# Patient Record
Sex: Female | Born: 1970 | Race: Asian | Hispanic: No | Marital: Married | Smoking: Never smoker
Health system: Southern US, Community
[De-identification: ages and names within clinical notes are randomized; demographics above are authoritative.]

## PROBLEM LIST (undated history)

## (undated) DIAGNOSIS — O24419 Gestational diabetes mellitus in pregnancy, unspecified control: Secondary | ICD-10-CM

## (undated) DIAGNOSIS — E782 Mixed hyperlipidemia: Secondary | ICD-10-CM

## (undated) DIAGNOSIS — J309 Allergic rhinitis, unspecified: Secondary | ICD-10-CM

## (undated) DIAGNOSIS — G5602 Carpal tunnel syndrome, left upper limb: Secondary | ICD-10-CM

## (undated) DIAGNOSIS — E119 Type 2 diabetes mellitus without complications: Secondary | ICD-10-CM

## (undated) DIAGNOSIS — Z91018 Allergy to other foods: Secondary | ICD-10-CM

## (undated) DIAGNOSIS — E042 Nontoxic multinodular goiter: Secondary | ICD-10-CM

## (undated) DIAGNOSIS — N201 Calculus of ureter: Secondary | ICD-10-CM

## (undated) DIAGNOSIS — G43909 Migraine, unspecified, not intractable, without status migrainosus: Secondary | ICD-10-CM

## (undated) HISTORY — DX: Nontoxic multinodular goiter: E04.2

## (undated) HISTORY — DX: Calculus of ureter: N20.1

## (undated) HISTORY — DX: Carpal tunnel syndrome, left upper limb: G56.02

## (undated) HISTORY — DX: Mixed hyperlipidemia: E78.2

## (undated) HISTORY — DX: Allergy to other foods: Z91.018

## (undated) HISTORY — DX: Type 2 diabetes mellitus without complications: E11.9

## (undated) HISTORY — DX: Allergic rhinitis, unspecified: J30.9

## (undated) HISTORY — PX: KNEE ARTHROSCOPY: SUR90

---

## 1998-10-10 HISTORY — PX: WISDOM TOOTH EXTRACTION: SHX21

## 2010-04-23 ENCOUNTER — Encounter: Admission: RE | Admit: 2010-04-23 | Discharge: 2010-04-23 | Payer: Self-pay | Admitting: Obstetrics and Gynecology

## 2010-12-28 LAB — RUBELLA ANTIBODY, IGM: Rubella: IMMUNE

## 2010-12-28 LAB — ANTIBODY SCREEN: Antibody Screen: NEGATIVE

## 2010-12-28 LAB — HEPATITIS B SURFACE ANTIGEN: Hepatitis B Surface Ag: NEGATIVE

## 2010-12-28 LAB — GC/CHLAMYDIA PROBE AMP, GENITAL
Chlamydia: NEGATIVE
Gonorrhea: NEGATIVE

## 2011-06-17 ENCOUNTER — Encounter: Payer: Managed Care, Other (non HMO) | Attending: Obstetrics and Gynecology | Admitting: Dietician

## 2011-06-17 ENCOUNTER — Encounter: Payer: Self-pay | Admitting: Dietician

## 2011-06-17 DIAGNOSIS — Z713 Dietary counseling and surveillance: Secondary | ICD-10-CM | POA: Insufficient documentation

## 2011-06-17 DIAGNOSIS — O99810 Abnormal glucose complicating pregnancy: Secondary | ICD-10-CM | POA: Insufficient documentation

## 2011-06-17 DIAGNOSIS — O24419 Gestational diabetes mellitus in pregnancy, unspecified control: Secondary | ICD-10-CM

## 2011-06-17 NOTE — Progress Notes (Signed)
  Patient was seen on 06/17/2011 for Gestational Diabetes self-management class at the [facility]. The following learning objectives were met by the patient during this course:   States the definition of Gestational Diabetes  States why dietary management is important in controlling blood glucose  Describes the effects each nutrient has on blood glucose levels  Demonstrates ability to create a balanced meal plan  Demonstrates carbohydrate counting   States when to check blood glucose levels  Demonstrates proper blood glucose monitoring techniques  States the effect of stress and exercise on blood glucose levels  States the importance of limiting caffeine and abstaining from alcohol and smoking  Blood glucose monitor given: One Touch Ultra  Lot # [id number] X Exp: 01/2012 Blood glucose reading: 104  Patient instructed to monitor glucose levels:Fasting and 2 hours after 1st bite of each meal FBS: 60 - <90 1 hour: <140 2 hour: <120  *Patient received handouts:  Nutrition Diabetes and Pregnancy  Carbohydrate Counting List  Patient will be seen for follow-up as needed.

## 2011-07-31 ENCOUNTER — Inpatient Hospital Stay (HOSPITAL_COMMUNITY)
Admission: AD | Admit: 2011-07-31 | Discharge: 2011-07-31 | Disposition: A | Discharge status: Home / Self Care | Payer: Managed Care, Other (non HMO) | Source: Ambulatory Visit | Attending: Obstetrics and Gynecology | Admitting: Obstetrics and Gynecology

## 2011-07-31 ENCOUNTER — Encounter (HOSPITAL_COMMUNITY): Payer: Self-pay | Admitting: *Deleted

## 2011-07-31 DIAGNOSIS — O479 False labor, unspecified: Secondary | ICD-10-CM | POA: Insufficient documentation

## 2011-07-31 HISTORY — DX: Gestational diabetes mellitus in pregnancy, unspecified control: O24.419

## 2011-07-31 NOTE — Progress Notes (Signed)
Pt presents to MAU with complaints of contractions. Pt states the contractions have gotten "tight" in the last 2 days. Pt states she has noticed increased mucous.

## 2011-07-31 NOTE — Progress Notes (Signed)
Pt reports having increasing ctx since last night. reports mucusy discharge. Pt reports good fetal movement

## 2011-07-31 NOTE — Progress Notes (Signed)
Dr. Rana Snare called regarding patient Tiffany Hicks. Notified him of cervical exam 1cm, 50%, -3, pt complaints of increased mucous, and would like to stay for 1 hr for monitoring due to far distance from home. Dr. Rana Snare OK with plan of care. Will recheck cervix in 1 hr.

## 2011-08-03 ENCOUNTER — Inpatient Hospital Stay (HOSPITAL_COMMUNITY)
Admission: AD | Admit: 2011-08-03 | Discharge: 2011-08-06 | DRG: 766 | Disposition: A | Discharge status: Home / Self Care | Payer: Managed Care, Other (non HMO) | Source: Ambulatory Visit | Attending: Obstetrics and Gynecology | Admitting: Obstetrics and Gynecology

## 2011-08-03 ENCOUNTER — Encounter (HOSPITAL_COMMUNITY): Payer: Self-pay | Admitting: *Deleted

## 2011-08-03 ENCOUNTER — Encounter (HOSPITAL_COMMUNITY): Payer: Self-pay | Admitting: Anesthesiology

## 2011-08-03 ENCOUNTER — Encounter (HOSPITAL_COMMUNITY)
Admission: AD | Disposition: A | Discharge status: Home / Self Care | Payer: Self-pay | Source: Ambulatory Visit | Attending: Obstetrics and Gynecology

## 2011-08-03 ENCOUNTER — Inpatient Hospital Stay (HOSPITAL_COMMUNITY): Payer: Managed Care, Other (non HMO) | Admitting: Anesthesiology

## 2011-08-03 DIAGNOSIS — O09529 Supervision of elderly multigravida, unspecified trimester: Secondary | ICD-10-CM | POA: Diagnosis present

## 2011-08-03 LAB — CBC
MCV: 91.7 fL (ref 78.0–100.0)
Platelets: 228 10*3/uL (ref 150–400)
RDW: 14.7 % (ref 11.5–15.5)
WBC: 8.4 10*3/uL (ref 4.0–10.5)

## 2011-08-03 LAB — GLUCOSE, CAPILLARY: Glucose-Capillary: 93 mg/dL (ref 70–99)

## 2011-08-03 LAB — RPR: RPR Ser Ql: NONREACTIVE

## 2011-08-03 SURGERY — Surgical Case
Anesthesia: Epidural | Site: Abdomen | Wound class: Clean Contaminated

## 2011-08-03 MED ORDER — LANOLIN HYDROUS EX OINT: 1.0000 "application " | TOPICAL_OINTMENT | CUTANEOUS | Status: DC | PRN

## 2011-08-03 MED ORDER — LACTATED RINGERS IV SOLN: 500.0000 mL | Freq: Once | INTRAVENOUS | Status: DC

## 2011-08-03 MED ORDER — ONDANSETRON HCL 4 MG/2ML IJ SOLN: 4.0000 mg | Freq: Three times a day (TID) | INTRAMUSCULAR | Status: DC | PRN

## 2011-08-03 MED ORDER — MENTHOL 3 MG MT LOZG
1.0000 | LOZENGE | OROMUCOSAL | Status: DC | PRN
  Administered 2011-08-04: 3 mg via ORAL
  Filled 2011-08-03: qty 9

## 2011-08-03 MED ORDER — FENTANYL CITRATE 0.05 MG/ML IJ SOLN
100.0000 ug | Freq: Once | INTRAMUSCULAR | Status: DC
  Filled 2011-08-03: qty 2

## 2011-08-03 MED ORDER — TETANUS-DIPHTH-ACELL PERTUSSIS 5-2.5-18.5 LF-MCG/0.5 IM SUSP
0.5000 mL | Freq: Once | INTRAMUSCULAR | Status: AC
  Administered 2011-08-05: 0.5 mL via INTRAMUSCULAR
  Filled 2011-08-03: qty 0.5

## 2011-08-03 MED ORDER — ONDANSETRON HCL 4 MG/2ML IJ SOLN
INTRAMUSCULAR | Status: AC
  Filled 2011-08-03: qty 2

## 2011-08-03 MED ORDER — FENTANYL CITRATE 0.05 MG/ML IJ SOLN
INTRAMUSCULAR | Status: DC | PRN
  Administered 2011-08-03: 50 ug via INTRAVENOUS
  Administered 2011-08-03: 50 ug via EPIDURAL
  Administered 2011-08-03: 100 ug via INTRAVENOUS

## 2011-08-03 MED ORDER — MORPHINE SULFATE (PF) 0.5 MG/ML IJ SOLN
INTRAMUSCULAR | Status: DC | PRN
  Administered 2011-08-03: 2 mg via INTRAVENOUS

## 2011-08-03 MED ORDER — SODIUM CHLORIDE 0.9 % IJ SOLN: 3.0000 mL | INTRAMUSCULAR | Status: DC | PRN

## 2011-08-03 MED ORDER — DIPHENHYDRAMINE HCL 50 MG/ML IJ SOLN: 12.5000 mg | INTRAMUSCULAR | Status: DC | PRN

## 2011-08-03 MED ORDER — OXYTOCIN BOLUS FROM INFUSION
500.0000 mL | Freq: Once | INTRAVENOUS | Status: DC
  Filled 2011-08-03: qty 500

## 2011-08-03 MED ORDER — NALBUPHINE HCL 10 MG/ML IJ SOLN
5.0000 mg | INTRAMUSCULAR | Status: DC | PRN
  Filled 2011-08-03: qty 1

## 2011-08-03 MED ORDER — BUPIVACAINE HCL (PF) 0.25 % IJ SOLN
INTRAMUSCULAR | Status: DC | PRN
  Administered 2011-08-03: 5 mL
  Administered 2011-08-03 (×2): 8 mL
  Administered 2011-08-03: 5 mL

## 2011-08-03 MED ORDER — LACTATED RINGERS IV SOLN
INTRAVENOUS | Status: DC
  Administered 2011-08-03 (×6): via INTRAVENOUS

## 2011-08-03 MED ORDER — OXYTOCIN 20 UNITS IN LACTATED RINGERS INFUSION - SIMPLE
1.0000 m[IU]/min | INTRAVENOUS | Status: DC
  Administered 2011-08-03: 1 m[IU]/min via INTRAVENOUS
  Filled 2011-08-03: qty 1000

## 2011-08-03 MED ORDER — KETOROLAC TROMETHAMINE 30 MG/ML IJ SOLN: 30.0000 mg | Freq: Four times a day (QID) | INTRAMUSCULAR | Status: AC | PRN

## 2011-08-03 MED ORDER — SODIUM CHLORIDE 0.9 % IV SOLN
1.0000 ug/kg/h | INTRAVENOUS | Status: DC | PRN
  Filled 2011-08-03: qty 2.5

## 2011-08-03 MED ORDER — CITRIC ACID-SODIUM CITRATE 334-500 MG/5ML PO SOLN
30.0000 mL | ORAL | Status: DC | PRN
  Administered 2011-08-03: 30 mL via ORAL
  Filled 2011-08-03: qty 15

## 2011-08-03 MED ORDER — LIDOCAINE HCL (PF) 1 % IJ SOLN
30.0000 mL | INTRAMUSCULAR | Status: DC | PRN
  Filled 2011-08-03: qty 30

## 2011-08-03 MED ORDER — EPHEDRINE 5 MG/ML INJ
10.0000 mg | INTRAVENOUS | Status: DC | PRN
  Administered 2011-08-03: 10 mg via INTRAVENOUS
  Filled 2011-08-03: qty 4

## 2011-08-03 MED ORDER — EPHEDRINE 5 MG/ML INJ
10.0000 mg | INTRAVENOUS | Status: DC | PRN
  Administered 2011-08-03: 10 mg via INTRAVENOUS
  Filled 2011-08-03 (×2): qty 4

## 2011-08-03 MED ORDER — SODIUM CHLORIDE 0.9 % IV SOLN
250.0000 mL | INTRAVENOUS | Status: DC
  Administered 2011-08-04: 250 mL via INTRAVENOUS
  Administered 2011-08-04: 1000 mL via INTRAVENOUS

## 2011-08-03 MED ORDER — OXYTOCIN 10 UNIT/ML IJ SOLN
INTRAMUSCULAR | Status: AC
  Filled 2011-08-03: qty 2

## 2011-08-03 MED ORDER — MEPERIDINE HCL 25 MG/ML IJ SOLN: 6.2500 mg | INTRAMUSCULAR | Status: DC | PRN

## 2011-08-03 MED ORDER — ONDANSETRON HCL 4 MG/2ML IJ SOLN
INTRAMUSCULAR | Status: DC | PRN
  Administered 2011-08-03: 4 mg via INTRAVENOUS

## 2011-08-03 MED ORDER — SIMETHICONE 80 MG PO CHEW: 80.0000 mg | CHEWABLE_TABLET | ORAL | Status: DC | PRN

## 2011-08-03 MED ORDER — ONDANSETRON HCL 4 MG/2ML IJ SOLN: 4.0000 mg | Freq: Four times a day (QID) | INTRAMUSCULAR | Status: DC | PRN

## 2011-08-03 MED ORDER — LACTATED RINGERS IV SOLN
500.0000 mL | INTRAVENOUS | Status: DC | PRN
  Administered 2011-08-03 (×2): 500 mL via INTRAVENOUS

## 2011-08-03 MED ORDER — DIPHENHYDRAMINE HCL 25 MG PO CAPS
25.0000 mg | ORAL_CAPSULE | Freq: Four times a day (QID) | ORAL | Status: DC | PRN
  Administered 2011-08-04: 25 mg via ORAL

## 2011-08-03 MED ORDER — EPHEDRINE 5 MG/ML INJ
10.0000 mg | INTRAVENOUS | Status: DC | PRN
  Filled 2011-08-03: qty 4

## 2011-08-03 MED ORDER — OXYTOCIN 20 UNITS IN LACTATED RINGERS INFUSION - SIMPLE: 125.0000 mL/h | INTRAVENOUS | Status: AC

## 2011-08-03 MED ORDER — DIPHENHYDRAMINE HCL 50 MG/ML IJ SOLN
INTRAMUSCULAR | Status: AC
  Filled 2011-08-03: qty 1

## 2011-08-03 MED ORDER — OXYCODONE-ACETAMINOPHEN 5-325 MG PO TABS
1.0000 | ORAL_TABLET | Freq: Four times a day (QID) | ORAL | Status: DC | PRN
  Administered 2011-08-04 (×2): 1 via ORAL
  Administered 2011-08-04: 2 via ORAL
  Administered 2011-08-05 – 2011-08-06 (×4): 1 via ORAL
  Filled 2011-08-03 (×5): qty 1
  Filled 2011-08-03: qty 2
  Filled 2011-08-03: qty 1

## 2011-08-03 MED ORDER — DIPHENHYDRAMINE HCL 50 MG/ML IJ SOLN
12.5000 mg | INTRAMUSCULAR | Status: DC | PRN
  Administered 2011-08-03: 12.5 mg via INTRAVENOUS

## 2011-08-03 MED ORDER — CEFAZOLIN SODIUM 1-5 GM-% IV SOLN
INTRAVENOUS | Status: DC | PRN
  Administered 2011-08-03: 1 g via INTRAVENOUS

## 2011-08-03 MED ORDER — ACETAMINOPHEN 325 MG PO TABS: 650.0000 mg | ORAL_TABLET | ORAL | Status: DC | PRN

## 2011-08-03 MED ORDER — SCOPOLAMINE 1 MG/3DAYS TD PT72
MEDICATED_PATCH | TRANSDERMAL | Status: AC
  Filled 2011-08-03: qty 1

## 2011-08-03 MED ORDER — FENTANYL CITRATE 0.05 MG/ML IJ SOLN
INTRAMUSCULAR | Status: AC
  Filled 2011-08-03: qty 2

## 2011-08-03 MED ORDER — LIDOCAINE-EPINEPHRINE (PF) 2 %-1:200000 IJ SOLN
INTRAMUSCULAR | Status: AC
  Filled 2011-08-03: qty 20

## 2011-08-03 MED ORDER — PHENYLEPHRINE 40 MCG/ML (10ML) SYRINGE FOR IV PUSH (FOR BLOOD PRESSURE SUPPORT)
80.0000 ug | PREFILLED_SYRINGE | INTRAVENOUS | Status: DC | PRN
  Filled 2011-08-03: qty 5

## 2011-08-03 MED ORDER — TERBUTALINE SULFATE 1 MG/ML IJ SOLN: 0.2500 mg | Freq: Once | INTRAMUSCULAR | Status: DC | PRN

## 2011-08-03 MED ORDER — SIMETHICONE 80 MG PO CHEW
80.0000 mg | CHEWABLE_TABLET | Freq: Three times a day (TID) | ORAL | Status: DC
  Administered 2011-08-04 – 2011-08-06 (×7): 80 mg via ORAL

## 2011-08-03 MED ORDER — BISACODYL 10 MG RE SUPP: 10.0000 mg | Freq: Every day | RECTAL | Status: DC | PRN

## 2011-08-03 MED ORDER — OXYTOCIN 20 UNITS IN LACTATED RINGERS INFUSION - SIMPLE: 125.0000 mL/h | Freq: Once | INTRAVENOUS | Status: DC

## 2011-08-03 MED ORDER — NALOXONE HCL 0.4 MG/ML IJ SOLN: 0.4000 mg | INTRAMUSCULAR | Status: DC | PRN

## 2011-08-03 MED ORDER — KETOROLAC TROMETHAMINE 60 MG/2ML IM SOLN
60.0000 mg | Freq: Once | INTRAMUSCULAR | Status: AC | PRN
  Administered 2011-08-03: 60 mg via INTRAMUSCULAR

## 2011-08-03 MED ORDER — IBUPROFEN 600 MG PO TABS
600.0000 mg | ORAL_TABLET | Freq: Four times a day (QID) | ORAL | Status: DC | PRN
  Administered 2011-08-04: 600 mg via ORAL
  Filled 2011-08-03: qty 1

## 2011-08-03 MED ORDER — FENTANYL CITRATE 0.05 MG/ML IJ SOLN: 100.0000 ug | Freq: Once | INTRAMUSCULAR | Status: DC

## 2011-08-03 MED ORDER — DIPHENHYDRAMINE HCL 25 MG PO CAPS
25.0000 mg | ORAL_CAPSULE | ORAL | Status: DC | PRN
  Administered 2011-08-04: 25 mg via ORAL
  Filled 2011-08-03 (×2): qty 1

## 2011-08-03 MED ORDER — SENNOSIDES-DOCUSATE SODIUM 8.6-50 MG PO TABS
2.0000 | ORAL_TABLET | Freq: Every day | ORAL | Status: DC
  Administered 2011-08-04 – 2011-08-05 (×2): 2 via ORAL

## 2011-08-03 MED ORDER — FLEET ENEMA 7-19 GM/118ML RE ENEM: 1.0000 | ENEMA | RECTAL | Status: DC | PRN

## 2011-08-03 MED ORDER — MEPERIDINE HCL 25 MG/ML IJ SOLN
INTRAMUSCULAR | Status: AC
  Filled 2011-08-03: qty 1

## 2011-08-03 MED ORDER — FENTANYL 2.5 MCG/ML BUPIVACAINE 1/10 % EPIDURAL INFUSION (WH - ANES)
14.0000 mL/h | INTRAMUSCULAR | Status: DC
  Filled 2011-08-03: qty 60

## 2011-08-03 MED ORDER — MORPHINE SULFATE 0.5 MG/ML IJ SOLN
INTRAMUSCULAR | Status: AC
  Filled 2011-08-03: qty 10

## 2011-08-03 MED ORDER — HYDROMORPHONE HCL 1 MG/ML IJ SOLN
INTRAMUSCULAR | Status: AC
  Administered 2011-08-03: 0.5 mg via INTRAVENOUS
  Filled 2011-08-03: qty 1

## 2011-08-03 MED ORDER — LIDOCAINE HCL 1.5 % IJ SOLN
INTRAMUSCULAR | Status: DC | PRN
  Administered 2011-08-03 (×2): 5 mL via INTRADERMAL

## 2011-08-03 MED ORDER — DIBUCAINE 1 % RE OINT: 1.0000 "application " | TOPICAL_OINTMENT | RECTAL | Status: DC | PRN

## 2011-08-03 MED ORDER — OXYTOCIN 20 UNITS IN LACTATED RINGERS INFUSION - SIMPLE
INTRAVENOUS | Status: DC | PRN
  Administered 2011-08-03 (×2): 20 [IU] via INTRAVENOUS

## 2011-08-03 MED ORDER — SODIUM BICARBONATE 8.4 % IV SOLN
INTRAVENOUS | Status: DC | PRN
  Administered 2011-08-03: 10 mL via EPIDURAL

## 2011-08-03 MED ORDER — SODIUM CHLORIDE 0.9 % IJ SOLN: 3.0000 mL | Freq: Two times a day (BID) | INTRAMUSCULAR | Status: DC

## 2011-08-03 MED ORDER — PHENYLEPHRINE 40 MCG/ML (10ML) SYRINGE FOR IV PUSH (FOR BLOOD PRESSURE SUPPORT)
PREFILLED_SYRINGE | INTRAVENOUS | Status: AC
  Filled 2011-08-03: qty 5

## 2011-08-03 MED ORDER — CEFAZOLIN SODIUM 1-5 GM-% IV SOLN
INTRAVENOUS | Status: AC
  Filled 2011-08-03: qty 50

## 2011-08-03 MED ORDER — WITCH HAZEL-GLYCERIN EX PADS: 1.0000 "application " | MEDICATED_PAD | CUTANEOUS | Status: DC | PRN

## 2011-08-03 MED ORDER — OXYCODONE-ACETAMINOPHEN 5-325 MG PO TABS: 2.0000 | ORAL_TABLET | ORAL | Status: DC | PRN

## 2011-08-03 MED ORDER — IBUPROFEN 600 MG PO TABS: 600.0000 mg | ORAL_TABLET | Freq: Four times a day (QID) | ORAL | Status: DC | PRN

## 2011-08-03 MED ORDER — PHENYLEPHRINE HCL 10 MG/ML IJ SOLN
INTRAMUSCULAR | Status: DC | PRN
  Administered 2011-08-03: 40 ug via INTRAVENOUS
  Administered 2011-08-03: 60 ug via INTRAVENOUS
  Administered 2011-08-03 (×2): 80 ug via INTRAVENOUS

## 2011-08-03 MED ORDER — DIPHENHYDRAMINE HCL 50 MG/ML IJ SOLN: 25.0000 mg | INTRAMUSCULAR | Status: DC | PRN

## 2011-08-03 MED ORDER — MEASLES, MUMPS & RUBELLA VAC ~~LOC~~ INJ
0.5000 mL | INJECTION | Freq: Once | SUBCUTANEOUS | Status: DC
  Filled 2011-08-03: qty 0.5

## 2011-08-03 MED ORDER — MEPERIDINE HCL 25 MG/ML IJ SOLN
INTRAMUSCULAR | Status: DC | PRN
  Administered 2011-08-03: 25 mg via INTRAVENOUS

## 2011-08-03 MED ORDER — KETOROLAC TROMETHAMINE 60 MG/2ML IM SOLN
INTRAMUSCULAR | Status: AC
  Filled 2011-08-03: qty 2

## 2011-08-03 MED ORDER — MORPHINE SULFATE (PF) 0.5 MG/ML IJ SOLN
INTRAMUSCULAR | Status: DC | PRN
  Administered 2011-08-03: 3 mg via EPIDURAL

## 2011-08-03 MED ORDER — IBUPROFEN 800 MG PO TABS
800.0000 mg | ORAL_TABLET | Freq: Three times a day (TID) | ORAL | Status: DC | PRN
  Administered 2011-08-05 – 2011-08-06 (×5): 800 mg via ORAL
  Filled 2011-08-03 (×5): qty 1

## 2011-08-03 MED ORDER — ZOLPIDEM TARTRATE 5 MG PO TABS: 5.0000 mg | ORAL_TABLET | Freq: Every evening | ORAL | Status: DC | PRN

## 2011-08-03 MED ORDER — METOCLOPRAMIDE HCL 5 MG/ML IJ SOLN: 10.0000 mg | Freq: Three times a day (TID) | INTRAMUSCULAR | Status: DC | PRN

## 2011-08-03 MED ORDER — LACTATED RINGERS IV SOLN
500.0000 mL | Freq: Once | INTRAVENOUS | Status: AC
  Administered 2011-08-03: 1000 mL via INTRAVENOUS

## 2011-08-03 MED ORDER — PHENYLEPHRINE 40 MCG/ML (10ML) SYRINGE FOR IV PUSH (FOR BLOOD PRESSURE SUPPORT)
80.0000 ug | PREFILLED_SYRINGE | INTRAVENOUS | Status: DC | PRN
  Filled 2011-08-03 (×2): qty 5

## 2011-08-03 MED ORDER — PRENATAL PLUS 27-1 MG PO TABS
1.0000 | ORAL_TABLET | Freq: Every day | ORAL | Status: DC
  Administered 2011-08-04 – 2011-08-06 (×3): 1 via ORAL
  Filled 2011-08-03 (×3): qty 1

## 2011-08-03 MED ORDER — FENTANYL 2.5 MCG/ML BUPIVACAINE 1/10 % EPIDURAL INFUSION (WH - ANES)
14.0000 mL/h | INTRAMUSCULAR | Status: DC
  Administered 2011-08-03 (×6): 14 mL/h via EPIDURAL
  Filled 2011-08-03 (×5): qty 60

## 2011-08-03 MED ORDER — EPHEDRINE 5 MG/ML INJ
INTRAVENOUS | Status: AC
  Filled 2011-08-03: qty 10

## 2011-08-03 MED ORDER — SCOPOLAMINE 1 MG/3DAYS TD PT72
1.0000 | MEDICATED_PATCH | Freq: Once | TRANSDERMAL | Status: DC
  Administered 2011-08-03: 1.5 mg via TRANSDERMAL

## 2011-08-03 SURGICAL SUPPLY — 28 items
BENZOIN TINCTURE PRP APPL 2/3 (GAUZE/BANDAGES/DRESSINGS) ×2 IMPLANT
CLOSURE STERI STRIP 1/2 X4 (GAUZE/BANDAGES/DRESSINGS) ×2 IMPLANT
CLOTH BEACON ORANGE TIMEOUT ST (SAFETY) ×2 IMPLANT
DRESSING TELFA 8X3 (GAUZE/BANDAGES/DRESSINGS) ×2 IMPLANT
DRSG COVADERM 4X8 (GAUZE/BANDAGES/DRESSINGS) ×2 IMPLANT
DRSG PAD ABDOMINAL 8X10 ST (GAUZE/BANDAGES/DRESSINGS) ×2 IMPLANT
ELECT REM PT RETURN 9FT ADLT (ELECTROSURGICAL) ×2
ELECTRODE REM PT RTRN 9FT ADLT (ELECTROSURGICAL) ×1 IMPLANT
EXTRACTOR VACUUM M CUP 4 TUBE (SUCTIONS) IMPLANT
GAUZE SPONGE 4X4 12PLY STRL LF (GAUZE/BANDAGES/DRESSINGS) IMPLANT
GLOVE BIO SURGEON STRL SZ7 (GLOVE) ×4 IMPLANT
GOWN PREVENTION PLUS LG XLONG (DISPOSABLE) ×4 IMPLANT
KIT ABG SYR 3ML LUER SLIP (SYRINGE) IMPLANT
NEEDLE HYPO 25X5/8 SAFETYGLIDE (NEEDLE) ×2 IMPLANT
NS IRRIG 1000ML POUR BTL (IV SOLUTION) ×2 IMPLANT
PACK C SECTION WH (CUSTOM PROCEDURE TRAY) ×2 IMPLANT
PAD ABD 7.5X8 STRL (GAUZE/BANDAGES/DRESSINGS) ×2 IMPLANT
SLEEVE SCD COMPRESS KNEE MED (MISCELLANEOUS) IMPLANT
SPONGE GAUZE 4X4 12PLY (GAUZE/BANDAGES/DRESSINGS) ×2 IMPLANT
SUT CHROMIC 0 CTX 36 (SUTURE) ×6 IMPLANT
SUT MON AB 4-0 PS1 27 (SUTURE) ×2 IMPLANT
SUT PDS AB 0 CT1 27 (SUTURE) ×4 IMPLANT
SUT VIC AB 3-0 CT1 27 (SUTURE) ×2
SUT VIC AB 3-0 CT1 TAPERPNT 27 (SUTURE) ×2 IMPLANT
TAPE CLOTH SURG 4X10 WHT LF (GAUZE/BANDAGES/DRESSINGS) ×2 IMPLANT
TOWEL OR 17X24 6PK STRL BLUE (TOWEL DISPOSABLE) ×4 IMPLANT
TRAY FOLEY CATH 14FR (SET/KITS/TRAYS/PACK) IMPLANT
WATER STERILE IRR 1000ML POUR (IV SOLUTION) IMPLANT

## 2011-08-03 NOTE — Anesthesia Postprocedure Evaluation (Signed)
  Anesthesia Post-op Note  Patient: Tiffany Hicks  Procedure(s) Performed:  CESAREAN SECTION   Patient is awake, responsive, moving her legs, and has signs of resolution of her numbness. Pain and nausea are reasonably well controlled. Vital signs are stable and clinically acceptable. Oxygen saturation is clinically acceptable. There are no apparent anesthetic complications at this time. Patient is ready for discharge.

## 2011-08-03 NOTE — Progress Notes (Signed)
Patient's oxygen saturation on admission to floor 88% on room air. Admitting RN applied 2 liters oxygen to patient. On assessment at 2335, oxygen saturation 98% on 2L of oxygen. Oxygen removed. Saturation dropped to 87% on room air. Dr. Marcelle Overlie called with order received. Will continue to monitor patient.

## 2011-08-03 NOTE — Progress Notes (Signed)
SAYS SROM AT 0200-  WAS ASLEEP-.  SOME UC- BUT DOESN'T HURT.   VE  MAU- 1 CM  ON Sunday.

## 2011-08-03 NOTE — Anesthesia Preprocedure Evaluation (Signed)
Anesthesia Evaluation  Patient identified by MRN, date of birth, ID band Patient awake  General Assessment Comment  Reviewed: Allergy & Precautions, H&P , Patient's Chart, lab work & pertinent test results  Airway Mallampati: II TM Distance: >3 FB Neck ROM: full    Dental No notable dental hx.    Pulmonary  clear to auscultation  Pulmonary exam normal       Cardiovascular regular Normal    Neuro/Psych Negative Neurological ROS  Negative Psych ROS   GI/Hepatic negative GI ROS Neg liver ROS    Endo/Other  Negative Endocrine ROSDiabetes mellitus-, Well Controlled, Gestational  Renal/GU negative Renal ROS     Musculoskeletal   Abdominal   Peds  Hematology negative hematology ROS (+)   Anesthesia Other Findings   Reproductive/Obstetrics (+) Pregnancy                           Anesthesia Physical Anesthesia Plan  ASA: II  Anesthesia Plan: Epidural   Post-op Pain Management:    Induction:   Airway Management Planned:   Additional Equipment:   Intra-op Plan:   Post-operative Plan:   Informed Consent: I have reviewed the patients History and Physical, chart, labs and discussed the procedure including the risks, benefits and alternatives for the proposed anesthesia with the patient or authorized representative who has indicated his/her understanding and acceptance.     Plan Discussed with:   Anesthesia Plan Comments:         Anesthesia Quick Evaluation

## 2011-08-03 NOTE — Anesthesia Procedure Notes (Signed)
Epidural Patient location during procedure: OB Start time: 08/03/2011 8:46 AM  Staffing Performed by: anesthesiologist   Preanesthetic Checklist Completed: patient identified, site marked, surgical consent, pre-op evaluation, timeout performed, IV checked, risks and benefits discussed and monitors and equipment checked  Epidural Patient position: sitting Prep: site prepped and draped and DuraPrep Patient monitoring: continuous pulse ox and blood pressure Approach: midline Injection technique: LOR air and LOR saline  Needle:  Needle type: Tuohy  Needle gauge: 17 G Needle length: 9 cm Needle insertion depth: 5 cm cm Catheter type: closed end flexible Catheter size: 19 Gauge Catheter at skin depth: 10 cm Test dose: negative  Assessment Events: blood not aspirated, injection not painful, no injection resistance, negative IV test and no paresthesia  Additional Notes Patient identified.  Risk benefits discussed including failed block, incomplete pain control, headache, nerve damage, paralysis, blood pressure changes, nausea, vomiting, reactions to medication both toxic or allergic, and postpartum back pain.  Patient expressed understanding and wished to proceed.  All questions were answered.  Sterile technique used throughout procedure and epidural site dressed with sterile barrier dressing. No paresthesia or other complications noted.The patient did not experience any signs of intravascular injection such as tinnitus or metallic taste in mouth nor signs of intrathecal spread such as rapid motor block. Please see nursing notes for vital signs.

## 2011-08-03 NOTE — Op Note (Signed)
Preoperative diagnosis: Failure to progress  Postoperative diagnosis same  Procedure: Primary low transverse cesarean section  Surgeon: Marcelle Overlie  Anesthesia: Epidural  EBL: 700 cc  Specimens removed, placenta to labor and delivery  Procedure and findings:  The patient was taken to the operating room after an adequate level of epidural anesthesia was obtained with the patient in left tilt position the abdomen was prepped and draped in the usual usual manner for sterile bowel procedures. Foley catheter positioned draining clear urine transverse incision made 2 finger breaths above the symphysis carried down to the fascia which was incised and extended transversely, rectus muscle divided in the midline peritoneum was entered superiorly without incident and extended in a vertical fashion. The vesicouterine serosa was incised and the bladder was bluntly and sharply dissected below, bladder blade was repositioned transverse incision made the lower segment extended with blunt dissection the patient then delivered of a healthy infant Apgars 9 and 9 loose nuchal cord x1. The infant was suctioned cord clamped and passed the pediatric team for further care the placenta was then delivered manually intact, uterus exteriorized cavity wiped clean with a laparotomy pack closure obtained with the first layer of 0 chromic in a locked fashion followed by an imbricating layer of 0 chromic. This was hemostatic the bladder flap area was intact and hemostatic bilateral tubes and ovaries were normal prior to closure sponge tinnitus which has reported as correct x2. The enclose a running 2-0 Vicryl suture. 2-0 Vicryl interrupted sutures used to reapproximate the rectus muscles in the midline a 0 PDS suture was then used to close fascia from laterally to midline on either side subcutaneous tissue was minimal was irrigated noted to be hemostatic a 4-0 Monocryl subcuticular suture sterile dressing was applied she did receive  Ancef 1 g IV preop and Pitocin IV after the cord was clamped mother and baby doing well that point  Dictated with [facility] [employer], not proofread Tiffany Hicks.D.

## 2011-08-03 NOTE — Progress Notes (Signed)
Now 8/C/-1 , ISE on with + scalp stim, comfortable

## 2011-08-03 NOTE — H&P (Signed)
Tiffany Hicks is a 40 y.o. female presenting for SROM + early labor. Maternal Medical History:  Reason for admission: Reason for admission: rupture of membranes.  Contractions: Frequency: irregular.    Fetal activity: Perceived fetal activity is normal.      OB History    Grav Para Term Preterm Abortions TAB SAB Ect Mult Living   2 1 1  0 0 0 0 0 0 1     Past Medical History  Diagnosis Date  . Gestational diabetes    Past Surgical History  Procedure Date  . No past surgeries    Family History: family history includes Diabetes in her mother and Hypertension in her father and mother. Social History:  reports that she has never smoked. She has never used smokeless tobacco. She reports that she does not drink alcohol or use illicit drugs.  ROS  Dilation: 1 Effacement (%): Thick Station: -3 Exam by:: T. Lessard RN Blood pressure 98/55, pulse 76, temperature 98.7 F (37.1 C), temperature source Oral, resp. rate 18, height 5' (1.524 m), weight 72.576 kg (160 lb). Maternal Exam:  Uterine Assessment: Contraction strength is mild.  Contraction frequency is irregular.   Abdomen: Patient reports no abdominal tenderness. Fundal height is term.   Estimated fetal weight is AGA.   Fetal presentation: vertex  Introitus: Normal vulva. Normal vagina.    Physical Exam  Constitutional: She appears well-developed and well-nourished.  HENT:  Head: Normocephalic and atraumatic.  Neck: Normal range of motion. Neck supple.  Cardiovascular: Normal rate.   Respiratory: Effort normal and breath sounds normal.  Genitourinary:       AROM>>clear, cx 4/75/vtx/-2    Prenatal labs: ABO, Rh: B/Positive/-- (03/20 0000) Antibody: Negative (03/20 0000) Rubella: Immune (03/20 0000) RPR: Nonreactive (03/20 0000)  HBsAg: Negative (03/20 0000)  HIV: Non-reactive (03/20 0000)  GBS: Negative (09/28 0000)   Assessment/Plan: SROM at term, requests epidural   Medford Staheli M 08/03/2011, 8:04  AM

## 2011-08-03 NOTE — Transfer of Care (Signed)
Immediate Anesthesia Transfer of Care Note  Patient: Tiffany Hicks  Procedure(s) Performed:  CESAREAN SECTION  Patient Location: PACU  Anesthesia Type: Epidural  Level of Consciousness: awake, alert  and oriented  Airway & Oxygen Therapy: Patient Spontanous Breathing  Post-op Assessment: Report given to PACU RN and Post -op Vital signs reviewed and stable  Post vital signs: Reviewed and stable  Complications: No apparent anesthesia complications

## 2011-08-04 ENCOUNTER — Encounter (HOSPITAL_COMMUNITY): Payer: Self-pay | Admitting: Obstetrics and Gynecology

## 2011-08-04 LAB — CBC
HCT: 31 % — ABNORMAL LOW (ref 36.0–46.0)
MCV: 92.8 fL (ref 78.0–100.0)
RBC: 3.34 MIL/uL — ABNORMAL LOW (ref 3.87–5.11)
WBC: 17 10*3/uL — ABNORMAL HIGH (ref 4.0–10.5)

## 2011-08-04 MED ORDER — INFLUENZA VIRUS VACC SPLIT PF IM SUSP
0.5000 mL | Freq: Once | INTRAMUSCULAR | Status: AC
  Administered 2011-08-05: 0.5 mL via INTRAMUSCULAR
  Filled 2011-08-04: qty 0.5

## 2011-08-04 NOTE — Progress Notes (Signed)
Subjective: Postpartum Day 1: Cesarean Delivery Patient reports tolerating PO.    Objective: Vital signs in last 24 hours: Temp:  [98.1 F (36.7 C)-99.6 F (37.6 C)] 99.6 F (37.6 C) (10/25 0605) Pulse Rate:  [75-125] 95  (10/25 0605) Resp:  [14-22] 14  (10/25 0605) BP: (88-144)/(38-92) 112/67 mmHg (10/25 0605) SpO2:  [87 %-99 %] 98 % (10/25 1610)  Physical Exam:  General: alert and cooperative Lochia: appropriate Uterine Fundus: firm, slightly deviated to R of midline, U/E Abdominal dressing CDI Foley with adequate OP DVT Evaluation: No evidence of DVT seen on physical exam.   Basename 08/04/11 0525 08/03/11 0400  HGB 10.3* 12.9  HCT 31.0* 38.6    Assessment/Plan: Status post Cesarean section. Doing well postoperatively.  Continue current care.  Panzy Bubeck G 08/04/2011, 8:37 AM

## 2011-08-05 MED ORDER — OXYCODONE-ACETAMINOPHEN 5-325 MG PO TABS
1.0000 | ORAL_TABLET | Freq: Four times a day (QID) | ORAL | Status: AC | PRN
Start: 2011-08-05 — End: 2011-08-15

## 2011-08-05 MED ORDER — IBUPROFEN 600 MG PO TABS: 600.0000 mg | ORAL_TABLET | Freq: Four times a day (QID) | ORAL | Status: AC | PRN

## 2011-08-05 MED ORDER — PRENATAL PLUS 27-1 MG PO TABS: 1.0000 | ORAL_TABLET | Freq: Every day | ORAL | Status: DC

## 2011-08-05 NOTE — Anesthesia Postprocedure Evaluation (Signed)
  Anesthesia Post-op Note  Patient: Tiffany Hicks  Procedure(s) Performed:  CESAREAN SECTION  Patient Location: PACU and Mother/Baby  Anesthesia Type: Epidural  Level of Consciousness: awake, alert  and oriented  Airway and Oxygen Therapy: Patient Spontanous Breathing  Post-op Pain: none  Post-op Assessment: Post-op Vital signs reviewed, Patient's Cardiovascular Status Stable, No headache, No backache, No residual numbness and No residual motor weakness  Post-op Vital Signs: Reviewed and stable  Complications: No apparent anesthesia complications

## 2011-08-05 NOTE — Progress Notes (Signed)
Subjective: Postpartum Day 2: Cesarean Delivery Patient reports tolerating PO, + flatus and no problems voiding.    Objective: Vital signs in last 24 hours: Temp:  [97.5 F (36.4 C)-99.2 F (37.3 C)] 97.5 F (36.4 C) (10/26 0648) Pulse Rate:  [82-97] 82  (10/26 0648) Resp:  [16-22] 18  (10/26 0648) BP: (102-118)/(62-76) 118/76 mmHg (10/26 0648) SpO2:  [93 %-100 %] 98 % (10/26 0100)  Physical Exam:  General: alert and cooperative Lochia: appropriate Uterine Fundus: firm Incision: healing well DVT Evaluation: No evidence of DVT seen on physical exam.   Basename 08/04/11 0525 08/03/11 0400  HGB 10.3* 12.9  HCT 31.0* 38.6    Assessment/Plan: Status post Cesarean section. Doing well postoperatively.  Discharge home with standard precautions and return to clinic in 1-2 weeks.  Tiffany Hicks G 08/05/2011, 8:37 AM

## 2011-08-05 NOTE — Discharge Summary (Signed)
Obstetric Discharge Summary Reason for Admission: rupture of membranes Prenatal Procedures: ultrasound Intrapartum Procedures: cesarean: low cervical, transverse Postpartum Procedures: none Complications-Operative and Postpartum: none Hemoglobin  Date Value Range Status  08/04/2011 10.3* 12.0-15.0 (Hicks/dL) Final     DELTA CHECK NOTED     REPEATED TO VERIFY     HCT  Date Value Range Status  08/04/2011 31.0* 36.0-46.0 (%) Final    Discharge Diagnoses: Term Pregnancy-delivered  Discharge Information: Date: 08/05/2011 Activity: pelvic rest Diet: routine Medications: PNV, Ibuprofen and Percocet Condition: improved Instructions: refer to practice specific booklet Discharge to: home   Newborn Data: Live born female  Birth Weight: 7 lb 10.8 oz ([id number] Hicks) APGAR: 9, 9  Home with mother.  Tiffany Hicks 08/05/2011, 9:25 AM

## 2011-08-05 NOTE — Addendum Note (Signed)
Addendum  created 08/05/11 1610 by Suella Grove   Modules edited:Notes Section

## 2011-08-05 NOTE — Addendum Note (Signed)
Addendum  created 08/05/11 1610 by Madison Hickman   Modules edited:Notes Section

## 2011-08-05 NOTE — Anesthesia Postprocedure Evaluation (Signed)
  Anesthesia Post-op Note  Patient: Tiffany Hicks  Procedure(s) Performed:  CESAREAN SECTION  Patient Location: Mother/Baby  Anesthesia Type: Epidural  Level of Consciousness: awake  Airway and Oxygen Therapy: Patient Spontanous Breathing  Post-op Pain: none  Post-op Assessment: Patient's Cardiovascular Status Stable, Respiratory Function Stable and No signs of Nausea or vomiting  Post-op Vital Signs: Reviewed and stable  Complications: No apparent anesthesia complications

## 2011-08-06 NOTE — Progress Notes (Signed)
Subjective: Postpartum Day 3: Cesarean Delivery Patient reports tolerating PO, + flatus and no problems voiding.    Objective: Vital signs in last 24 hours: Temp:  [97.7 F (36.5 C)-97.9 F (36.6 C)] 97.9 F (36.6 C) (10/27 0629) Pulse Rate:  [63-97] 63  (10/27 0629) Resp:  [18] 18  (10/27 0629) BP: (123-133)/(81-85) 123/81 mmHg (10/27 7829)  Physical Exam:  General: alert and cooperative Lochia: appropriate Uterine Fundus: firm Incision: healing well, no significant drainage DVT Evaluation: No evidence of DVT seen on physical exam.   Basename 08/04/11 0525  HGB 10.3*  HCT 31.0*    Assessment/Plan: Status post Cesarean section. Doing well postoperatively.  Discharge home with standard precautions and return to clinic in 1-2 weeks.  Swayze Pries 08/06/2011, 8:25 AM

## 2013-07-22 ENCOUNTER — Other Ambulatory Visit: Payer: Self-pay | Admitting: Obstetrics and Gynecology

## 2013-07-22 ENCOUNTER — Other Ambulatory Visit: Payer: Self-pay

## 2013-07-22 DIAGNOSIS — R928 Other abnormal and inconclusive findings on diagnostic imaging of breast: Secondary | ICD-10-CM

## 2013-07-22 DIAGNOSIS — Z1231 Encounter for screening mammogram for malignant neoplasm of breast: Secondary | ICD-10-CM

## 2013-08-06 ENCOUNTER — Other Ambulatory Visit: Payer: Managed Care, Other (non HMO)

## 2013-08-16 ENCOUNTER — Ambulatory Visit: Payer: Managed Care, Other (non HMO)

## 2013-08-16 ENCOUNTER — Ambulatory Visit
Admission: RE | Admit: 2013-08-16 | Discharge: 2013-08-16 | Disposition: A | Discharge status: Home / Self Care | Payer: Managed Care, Other (non HMO) | Source: Ambulatory Visit | Attending: Obstetrics and Gynecology | Admitting: Obstetrics and Gynecology

## 2013-08-16 DIAGNOSIS — R928 Other abnormal and inconclusive findings on diagnostic imaging of breast: Secondary | ICD-10-CM

## 2014-07-14 ENCOUNTER — Other Ambulatory Visit: Payer: Self-pay

## 2014-07-14 DIAGNOSIS — Z1231 Encounter for screening mammogram for malignant neoplasm of breast: Secondary | ICD-10-CM

## 2014-08-11 ENCOUNTER — Encounter (HOSPITAL_COMMUNITY): Payer: Self-pay | Admitting: Obstetrics and Gynecology

## 2014-08-29 ENCOUNTER — Ambulatory Visit
Admission: RE | Admit: 2014-08-29 | Discharge: 2014-08-29 | Disposition: A | Discharge status: Home / Self Care | Payer: Managed Care, Other (non HMO) | Source: Ambulatory Visit

## 2014-08-29 DIAGNOSIS — Z1231 Encounter for screening mammogram for malignant neoplasm of breast: Secondary | ICD-10-CM

## 2015-08-05 ENCOUNTER — Other Ambulatory Visit: Payer: Self-pay

## 2015-08-05 DIAGNOSIS — Z1231 Encounter for screening mammogram for malignant neoplasm of breast: Secondary | ICD-10-CM

## 2015-09-10 ENCOUNTER — Ambulatory Visit
Admission: RE | Admit: 2015-09-10 | Discharge: 2015-09-10 | Disposition: A | Discharge status: Home / Self Care | Payer: Managed Care, Other (non HMO) | Source: Ambulatory Visit

## 2015-09-10 DIAGNOSIS — Z1231 Encounter for screening mammogram for malignant neoplasm of breast: Secondary | ICD-10-CM

## 2015-09-14 ENCOUNTER — Other Ambulatory Visit: Payer: Self-pay | Admitting: Obstetrics and Gynecology

## 2015-09-14 DIAGNOSIS — R928 Other abnormal and inconclusive findings on diagnostic imaging of breast: Secondary | ICD-10-CM

## 2015-09-18 ENCOUNTER — Ambulatory Visit
Admission: RE | Admit: 2015-09-18 | Discharge: 2015-09-18 | Disposition: A | Discharge status: Home / Self Care | Payer: Managed Care, Other (non HMO) | Source: Ambulatory Visit | Attending: Obstetrics and Gynecology | Admitting: Obstetrics and Gynecology

## 2015-09-18 DIAGNOSIS — R928 Other abnormal and inconclusive findings on diagnostic imaging of breast: Secondary | ICD-10-CM

## 2016-01-08 DIAGNOSIS — E663 Overweight: Secondary | ICD-10-CM | POA: Insufficient documentation

## 2016-01-08 DIAGNOSIS — M7731 Calcaneal spur, right foot: Secondary | ICD-10-CM | POA: Insufficient documentation

## 2016-01-08 DIAGNOSIS — E782 Mixed hyperlipidemia: Secondary | ICD-10-CM | POA: Insufficient documentation

## 2016-08-22 ENCOUNTER — Other Ambulatory Visit: Payer: Self-pay | Admitting: Obstetrics and Gynecology

## 2016-08-22 DIAGNOSIS — Z1231 Encounter for screening mammogram for malignant neoplasm of breast: Secondary | ICD-10-CM

## 2016-09-27 ENCOUNTER — Ambulatory Visit
Admission: RE | Admit: 2016-09-27 | Discharge: 2016-09-27 | Disposition: A | Discharge status: Home / Self Care | Payer: Managed Care, Other (non HMO) | Source: Ambulatory Visit | Attending: Obstetrics and Gynecology | Admitting: Obstetrics and Gynecology

## 2016-09-27 DIAGNOSIS — Z1231 Encounter for screening mammogram for malignant neoplasm of breast: Secondary | ICD-10-CM

## 2016-11-02 ENCOUNTER — Other Ambulatory Visit: Payer: Self-pay | Admitting: Obstetrics and Gynecology

## 2016-11-02 DIAGNOSIS — E041 Nontoxic single thyroid nodule: Secondary | ICD-10-CM

## 2016-11-04 ENCOUNTER — Ambulatory Visit
Admission: RE | Admit: 2016-11-04 | Discharge: 2016-11-04 | Disposition: A | Discharge status: Home / Self Care | Payer: Commercial Managed Care - PPO | Source: Ambulatory Visit | Attending: Obstetrics and Gynecology | Admitting: Obstetrics and Gynecology

## 2016-11-04 DIAGNOSIS — E041 Nontoxic single thyroid nodule: Secondary | ICD-10-CM

## 2017-03-27 DIAGNOSIS — R0609 Other forms of dyspnea: Secondary | ICD-10-CM | POA: Insufficient documentation

## 2017-03-27 DIAGNOSIS — E042 Nontoxic multinodular goiter: Secondary | ICD-10-CM | POA: Insufficient documentation

## 2017-03-27 DIAGNOSIS — G4489 Other headache syndrome: Secondary | ICD-10-CM | POA: Insufficient documentation

## 2017-03-27 DIAGNOSIS — G5602 Carpal tunnel syndrome, left upper limb: Secondary | ICD-10-CM | POA: Insufficient documentation

## 2017-03-27 DIAGNOSIS — Z801 Family history of malignant neoplasm of trachea, bronchus and lung: Secondary | ICD-10-CM | POA: Insufficient documentation

## 2017-03-27 LAB — HEPATIC FUNCTION PANEL
ALK PHOS: 45 (ref 25–125)
ALT: 13 (ref 7–35)
AST: 20 (ref 13–35)
BILIRUBIN, TOTAL: 0.4

## 2017-03-27 LAB — BASIC METABOLIC PANEL
BUN: 11 (ref 4–21)
Creatinine: 0.5 (ref 0.5–1.1)
POTASSIUM: 4 (ref 3.4–5.3)
Sodium: 139 (ref 137–147)

## 2017-03-27 LAB — CBC AND DIFFERENTIAL
HEMATOCRIT: 38 (ref 36–46)
HEMOGLOBIN: 13 (ref 12.0–16.0)
Platelets: 308 (ref 150–399)
WBC: 5.1

## 2017-03-27 LAB — TSH: TSH: 0.65 (ref 0.41–5.90)

## 2017-08-18 ENCOUNTER — Other Ambulatory Visit: Payer: Self-pay | Admitting: Obstetrics and Gynecology

## 2017-08-18 DIAGNOSIS — Z1231 Encounter for screening mammogram for malignant neoplasm of breast: Secondary | ICD-10-CM

## 2017-09-28 ENCOUNTER — Ambulatory Visit
Admission: RE | Admit: 2017-09-28 | Discharge: 2017-09-28 | Disposition: A | Discharge status: Home / Self Care | Payer: Commercial Managed Care - PPO | Source: Ambulatory Visit | Attending: Obstetrics and Gynecology | Admitting: Obstetrics and Gynecology

## 2017-09-28 DIAGNOSIS — Z1231 Encounter for screening mammogram for malignant neoplasm of breast: Secondary | ICD-10-CM

## 2017-11-03 ENCOUNTER — Other Ambulatory Visit: Payer: Self-pay | Admitting: Obstetrics and Gynecology

## 2017-11-03 DIAGNOSIS — E041 Nontoxic single thyroid nodule: Secondary | ICD-10-CM

## 2017-11-08 ENCOUNTER — Other Ambulatory Visit: Payer: Commercial Managed Care - PPO

## 2017-11-10 ENCOUNTER — Ambulatory Visit
Admission: RE | Admit: 2017-11-10 | Discharge: 2017-11-10 | Disposition: A | Discharge status: Home / Self Care | Payer: Commercial Managed Care - PPO | Source: Ambulatory Visit | Attending: Obstetrics and Gynecology | Admitting: Obstetrics and Gynecology

## 2017-11-10 DIAGNOSIS — E041 Nontoxic single thyroid nodule: Secondary | ICD-10-CM

## 2017-11-13 ENCOUNTER — Encounter: Payer: Self-pay | Admitting: Genetic Counselor

## 2017-11-13 ENCOUNTER — Telehealth: Payer: Self-pay | Admitting: Genetic Counselor

## 2017-11-13 NOTE — Telephone Encounter (Signed)
Genetic counseling appt has been scheduled for the pt to see Roma Kayser on 3/20 at 9am. Pt aware to arrive 30 minutes early. Letter mailed to the pt with the appt information and faxed to the referring.

## 2017-11-14 ENCOUNTER — Other Ambulatory Visit: Payer: Self-pay | Admitting: Obstetrics and Gynecology

## 2017-11-14 DIAGNOSIS — E041 Nontoxic single thyroid nodule: Secondary | ICD-10-CM

## 2017-11-15 ENCOUNTER — Other Ambulatory Visit (HOSPITAL_COMMUNITY)
Admission: RE | Admit: 2017-11-15 | Discharge: 2017-11-15 | Disposition: A | Discharge status: Home / Self Care | Payer: Commercial Managed Care - PPO | Source: Ambulatory Visit | Attending: Student | Admitting: Student

## 2017-11-15 ENCOUNTER — Ambulatory Visit
Admission: RE | Admit: 2017-11-15 | Discharge: 2017-11-15 | Disposition: A | Discharge status: Home / Self Care | Payer: Commercial Managed Care - PPO | Source: Ambulatory Visit | Attending: Obstetrics and Gynecology | Admitting: Obstetrics and Gynecology

## 2017-11-15 DIAGNOSIS — E041 Nontoxic single thyroid nodule: Secondary | ICD-10-CM | POA: Diagnosis present

## 2017-11-15 HISTORY — PX: BIOPSY THYROID: PRO38

## 2017-12-27 ENCOUNTER — Encounter: Payer: Commercial Managed Care - PPO | Admitting: Genetic Counselor

## 2017-12-27 ENCOUNTER — Other Ambulatory Visit: Payer: Commercial Managed Care - PPO

## 2018-05-03 ENCOUNTER — Encounter: Payer: Self-pay | Admitting: *Deleted

## 2018-05-04 ENCOUNTER — Encounter: Payer: Self-pay | Admitting: Family Medicine

## 2018-05-04 ENCOUNTER — Ambulatory Visit: Payer: Commercial Managed Care - PPO | Admitting: Family Medicine

## 2018-05-04 ENCOUNTER — Telehealth: Payer: Self-pay | Admitting: *Deleted

## 2018-05-04 VITALS — BP 108/72 | HR 72 | Temp 98.3°F | Resp 16 | Ht 60.0 in | Wt 135.4 lb

## 2018-05-04 DIAGNOSIS — H1013 Acute atopic conjunctivitis, bilateral: Secondary | ICD-10-CM

## 2018-05-04 DIAGNOSIS — R7301 Impaired fasting glucose: Secondary | ICD-10-CM

## 2018-05-04 DIAGNOSIS — E663 Overweight: Secondary | ICD-10-CM

## 2018-05-04 DIAGNOSIS — E782 Mixed hyperlipidemia: Secondary | ICD-10-CM | POA: Diagnosis not present

## 2018-05-04 DIAGNOSIS — J302 Other seasonal allergic rhinitis: Secondary | ICD-10-CM

## 2018-05-04 LAB — LIPID PANEL
Cholesterol: 253 — AB (ref 0–200)
HDL: 49 (ref 35–70)
LDL CALC: 181
LDl/HDL Ratio: 3.7
Triglycerides: 111 (ref 40–160)

## 2018-05-04 LAB — BASIC METABOLIC PANEL
Glucose: 102
Glucose: 102

## 2018-05-04 MED ORDER — TRIAMCINOLONE ACETONIDE 55 MCG/ACT NA AERO
2.0000 | INHALATION_SPRAY | Freq: Every day | NASAL | 6 refills | Status: DC
Start: 2018-05-04 — End: 2018-09-04

## 2018-05-04 MED ORDER — FEXOFENADINE HCL 180 MG PO TABS: 180.0000 mg | ORAL_TABLET | Freq: Every day | ORAL | 6 refills | Status: DC

## 2018-05-04 NOTE — Telephone Encounter (Signed)
Received medical records from [facility].  I reviewed records and abstracted information into pts chart.   Records have been placed on Dr. Idelle Leech desk for review.

## 2018-05-04 NOTE — Progress Notes (Signed)
Office Note 05/04/2018  CC:  Chief Complaint  Patient presents with  . Establish Care  . Contraception  . Foot Pain    right foot more than the left    HPI:  Tiffany Hicks is a 47 y.o. female who is here accompanied by her husband and 2 children to establish care. Patient's most recent primary MD: [facility] [place].  Dr. Radene Knee is her GYN. Old records were reviewed prior to or during today's visit.  1) Allergies: sneezing all day, eyes itchy and dry/rubs eyes a lot, dry cough, fullness below eyes.  No wheezing or chest tightness. Stuffy and runny nose.  Taking flonase and zyrtec and this helps some. No eye drops.  Occ eczema. Snores but no apnea noted in sleep.    2) Has question about getting back on "contraceptive".  Sounds like in the remote past she was on this to help not only with birth control but acne. She is having intermittent mild acne outbreaks on face---this is why she asks for this type of med again. When questioned further: Has regularly occurring menses with normal blood flow.  +Hot flashes.  Poor sleep. Mood swings.  Feeling more easily anxious and overwhelmed with things--she is a busy mom of 2 young kids.  Husband asks if she may be in menopause.  3) She had some labs done at her employer's this morning: fasting gluc 103.  Lipids significantly elevated. She has hx of gestational DM with both of her pregnancies.  Says no probs with gluc's since then. Has hx of mildly elevated lipids, "but nothing like they were today". Past Medical History:  Diagnosis Date  . Allergic rhinitis    Allergy testing approx 2012.  Marland Kitchen Gestational diabetes    Both pregnancies.  . Hyperlipemia, mixed   . Left carpal tunnel syndrome   . Multiple thyroid nodules    FNA of one nodule: benign follic nodule/cyst on 10/18/12.  Needs f/u u/s 11/2018 for other nodules.    Past Surgical History:  Procedure Laterality Date  . BIOPSY THYROID Right 11/15/2017   benign.    . CESAREAN  SECTION  08/03/2011   Procedure: CESAREAN SECTION;  Surgeon: Margarette Asal;  Location: [facility];  Service: Gynecology;  Laterality: N/A;    Family History  Problem Relation Age of Onset  . Hypertension Mother   . Diabetes Mother   . Lung cancer Mother        stage 4, non-smoker  . Hypertension Father   . Hyperlipidemia Father   . Diabetes Sister   . Hyperlipidemia Brother   . Diabetes Brother   . Diabetes Brother   . Hyperlipidemia Brother     Social History   Socioeconomic History  . Marital status: Married    Spouse name: Not on file  . Number of children: Not on file  . Years of education: Not on file  . Highest education level: Not on file  Occupational History  . Not on file  Social Needs  . Financial resource strain: Not on file  . Food insecurity:    Worry: Not on file    Inability: Not on file  . Transportation needs:    Medical: Not on file    Non-medical: Not on file  Tobacco Use  . Smoking status: Never Smoker  . Smokeless tobacco: Never Used  Substance and Sexual Activity  . Alcohol use: No  . Drug use: No  . Sexual activity: Yes  Lifestyle  . Physical  activity:    Days per week: Not on file    Minutes per session: Not on file  . Stress: Not on file  Relationships  . Social connections:    Talks on phone: Not on file    Gets together: Not on file    Attends religious service: Not on file    Active member of club or organization: Not on file    Attends meetings of clubs or organizations: Not on file    Relationship status: Not on file  . Intimate partner violence:    Fear of current or ex partner: Not on file    Emotionally abused: Not on file    Physically abused: Not on file    Forced sexual activity: Not on file  Other Topics Concern  . Not on file  Social History Narrative   Married, 2 children.   Lives in [place].   Educ: Masters   Personnel officer at [employer].    Outpatient Encounter Medications as of 05/04/2018  Medication  Sig  . fexofenadine (ALLEGRA) 180 MG tablet Take 1 tablet (180 mg total) by mouth daily.  Marland Kitchen triamcinolone (NASACORT ALLERGY 24HR) 55 MCG/ACT AERO nasal inhaler Place 2 sprays into the nose daily.  . [DISCONTINUED] prenatal vitamin w/FE, FA (PRENATAL 1 + 1) 27-1 MG TABS Take 1 tablet by mouth daily. (Patient not taking: Reported on 05/04/2018)   No facility-administered encounter medications on file as of 05/04/2018.     Allergies  Allergen Reactions  . Cucumber Extract Rash  . Mushroom Extract Complex Rash  . Peanut-Containing Drug Products Rash  . Soy Allergy Rash    ROS Review of Systems  Constitutional: Negative for fatigue and fever.  HENT: Negative for congestion and sore throat.        See HPI  Eyes: Negative for visual disturbance.  Respiratory: Positive for cough (as per hpi).   Cardiovascular: Negative for chest pain.  Gastrointestinal: Negative for abdominal pain and nausea.  Endocrine: Negative for polydipsia, polyphagia and polyuria.  Genitourinary: Negative for dysuria.  Musculoskeletal: Negative for back pain and joint swelling.  Skin: Negative for rash.  Neurological: Negative for weakness and headaches.  Hematological: Negative for adenopathy.    PE; Blood pressure 108/72, pulse 72, temperature 98.3 F (36.8 C), temperature source Oral, resp. rate 16, height 5' (1.524 m), weight 135 lb 6 oz (61.4 kg), last menstrual period 04/28/2018, SpO2 98 %. Gen: Alert, well appearing.  Patient is oriented to person, place, time, and situation. AFFECT: pleasant, lucid thought and speech. CV: RRR, no m/r/g.   LUNGS: CTA bilat, nonlabored resps, good aeration in all lung fields. EXT: no clubbing, cyanosis, or edema.   Pertinent labs:  Lab Results  Component Value Date   TSH 0.65 03/27/2017   Lab Results  Component Value Date   WBC 5.1 03/27/2017   HGB 13.0 03/27/2017   HCT 38 03/27/2017   MCV 92.8 08/04/2011   PLT 308 03/27/2017   Lab Results  Component Value  Date   CREATININE 0.5 03/27/2017   BUN 11 03/27/2017   NA 139 03/27/2017   K 4.0 03/27/2017   Lab Results  Component Value Date   ALT 13 03/27/2017   AST 20 03/27/2017   ALKPHOS 45 03/27/2017   Lab Results  Component Value Date   CHOL 253 (A) 05/04/2018   Lab Results  Component Value Date   HDL 49 05/04/2018   Lab Results  Component Value Date   LDLCALC 181  05/04/2018   Lab Results  Component Value Date   TRIG 111 05/04/2018   No results found for: CHOLHDL No results found for: HGBA1C  ASSESSMENT AND PLAN:   New pt; prior PCP and EMR records reviewed.  1) IFG, hx of gestational DM: she'll return soon for CPE and will get fasting health panel labs, including HbA1c.  2) Mixed hyperlipidemia: labs this morning much more elevated than past lipids per pt report. She does not put much stock in the lab results from this morning b/c they were done via a vendor at her employer's office---which she feels is not a legitimate lab. We'll repeat labs at her upcoming CPE.  3) Allergic rhinitis: sx's not well controlled.  D/c flonase and zyrtec. Start nasacort qd and allegra 180mg  qd. Pt desires referral to allergist for consideration of repeat allergy testing--ordered referral today.  4) Perimenopause: suspected.  I recommended that she consult with her GYN MD regarding possibly starting estrogen replacement.  An After Visit Summary was printed and given to the patient.  Return for CPE at patient's convenience (fasting). Discuss feet pain at that time as well.  Signed:  [name], MD           05/04/2018

## 2018-05-10 DIAGNOSIS — E782 Mixed hyperlipidemia: Secondary | ICD-10-CM

## 2018-05-10 DIAGNOSIS — E119 Type 2 diabetes mellitus without complications: Secondary | ICD-10-CM

## 2018-05-10 HISTORY — DX: Mixed hyperlipidemia: E78.2

## 2018-05-10 HISTORY — DX: Type 2 diabetes mellitus without complications: E11.9

## 2018-06-01 ENCOUNTER — Ambulatory Visit (INDEPENDENT_AMBULATORY_CARE_PROVIDER_SITE_OTHER): Payer: Commercial Managed Care - PPO | Admitting: Family Medicine

## 2018-06-01 ENCOUNTER — Encounter: Payer: Self-pay | Admitting: Family Medicine

## 2018-06-01 VITALS — BP 119/80 | HR 79 | Temp 98.3°F | Resp 16 | Ht 60.0 in | Wt 137.1 lb

## 2018-06-01 DIAGNOSIS — E7800 Pure hypercholesterolemia, unspecified: Secondary | ICD-10-CM | POA: Diagnosis not present

## 2018-06-01 DIAGNOSIS — Z0000 Encounter for general adult medical examination without abnormal findings: Secondary | ICD-10-CM

## 2018-06-01 DIAGNOSIS — E663 Overweight: Secondary | ICD-10-CM

## 2018-06-01 DIAGNOSIS — Z8632 Personal history of gestational diabetes: Secondary | ICD-10-CM | POA: Diagnosis not present

## 2018-06-01 DIAGNOSIS — R7301 Impaired fasting glucose: Secondary | ICD-10-CM | POA: Diagnosis not present

## 2018-06-01 DIAGNOSIS — Z1211 Encounter for screening for malignant neoplasm of colon: Secondary | ICD-10-CM

## 2018-06-01 LAB — TSH: TSH: 1.09 u[IU]/mL (ref 0.35–4.50)

## 2018-06-01 LAB — CBC WITH DIFFERENTIAL/PLATELET
Basophils Absolute: 0 10*3/uL (ref 0.0–0.1)
Basophils Relative: 0.8 % (ref 0.0–3.0)
EOS PCT: 2.5 % (ref 0.0–5.0)
Eosinophils Absolute: 0.1 10*3/uL (ref 0.0–0.7)
HEMATOCRIT: 40.5 % (ref 36.0–46.0)
Hemoglobin: 13.5 g/dL (ref 12.0–15.0)
LYMPHS ABS: 2 10*3/uL (ref 0.7–4.0)
Lymphocytes Relative: 35 % (ref 12.0–46.0)
MCHC: 33.4 g/dL (ref 30.0–36.0)
MCV: 91 fl (ref 78.0–100.0)
Monocytes Absolute: 0.4 10*3/uL (ref 0.1–1.0)
Monocytes Relative: 6.2 % (ref 3.0–12.0)
NEUTROS ABS: 3.2 10*3/uL (ref 1.4–7.7)
NEUTROS PCT: 55.5 % (ref 43.0–77.0)
PLATELETS: 319 10*3/uL (ref 150.0–400.0)
RBC: 4.45 Mil/uL (ref 3.87–5.11)
RDW: 13.6 % (ref 11.5–15.5)
WBC: 5.8 10*3/uL (ref 4.0–10.5)

## 2018-06-01 LAB — COMPREHENSIVE METABOLIC PANEL
ALK PHOS: 47 U/L (ref 39–117)
ALT: 23 U/L (ref 0–35)
AST: 15 U/L (ref 0–37)
Albumin: 4.1 g/dL (ref 3.5–5.2)
BILIRUBIN TOTAL: 0.5 mg/dL (ref 0.2–1.2)
BUN: 11 mg/dL (ref 6–23)
CALCIUM: 9.5 mg/dL (ref 8.4–10.5)
CO2: 31 mEq/L (ref 19–32)
Chloride: 102 mEq/L (ref 96–112)
Creatinine, Ser: 0.57 mg/dL (ref 0.40–1.20)
GFR: 119.72 mL/min (ref >60.00)
GLUCOSE: 100 mg/dL — AB (ref 70–99)
POTASSIUM: 4.2 meq/L (ref 3.5–5.1)
Sodium: 137 mEq/L (ref 135–145)
TOTAL PROTEIN: 7.4 g/dL (ref 6.0–8.3)

## 2018-06-01 LAB — LIPID PANEL
CHOLESTEROL: 257 mg/dL — AB (ref 0–200)
HDL: 50.1 mg/dL (ref >39.00)
LDL Cholesterol: 189 mg/dL — ABNORMAL HIGH (ref 0–99)
NonHDL: 207.16
Total CHOL/HDL Ratio: 5
Triglycerides: 91 mg/dL (ref 0.0–149.0)
VLDL: 18.2 mg/dL (ref 0.0–40.0)

## 2018-06-01 LAB — HEMOGLOBIN A1C: Hgb A1c MFr Bld: 6.5 % (ref 4.6–6.5)

## 2018-06-01 NOTE — Patient Instructions (Signed)

## 2018-06-01 NOTE — Progress Notes (Signed)
Office Note 06/01/2018  CC:  Chief Complaint  Patient presents with  . Annual Exam    Pt is fasting.     HPI:  Tiffany Hicks is a 47 y.o. Asian female who is here for annual health maintenance exam.  Exercise: none.  Can't find time.   Diet: working on eating more fish and cutting back on red meat. Limiting portion size lately. Diet: prev UTD. Eyes: exam q2y UTD.  Labs via employer sponsored "lab day" at work (mobile) showed elevated cholesterol 1 mo ago. She doesn't trust results and wants this repeated today.  Past Medical History:  Diagnosis Date  . Allergic rhinitis    Allergy testing approx 2012.  Marland Kitchen Gestational diabetes    Both pregnancies.  . Hyperlipemia, mixed   . Left carpal tunnel syndrome   . Multiple thyroid nodules    FNA of one nodule: benign follic nodule/cyst on 04/09/20.  Needs f/u u/s 11/2018 for other nodules.    Past Surgical History:  Procedure Laterality Date  . BIOPSY THYROID Right 11/15/2017   benign.    . CESAREAN SECTION  08/03/2011   Procedure: CESAREAN SECTION;  Surgeon: Margarette Asal;  Location: [facility];  Service: Gynecology;  Laterality: N/A;    Family History  Problem Relation Age of Onset  . Hypertension Mother   . Diabetes Mother   . Lung cancer Mother        stage 4, non-smoker  . Hypertension Father   . Hyperlipidemia Father   . Diabetes Sister   . Hyperlipidemia Brother   . Diabetes Brother   . Diabetes Brother   . Hyperlipidemia Brother     Social History   Socioeconomic History  . Marital status: Married    Spouse name: Not on file  . Number of children: Not on file  . Years of education: Not on file  . Highest education level: Not on file  Occupational History  . Not on file  Social Needs  . Financial resource strain: Not on file  . Food insecurity:    Worry: Not on file    Inability: Not on file  . Transportation needs:    Medical: Not on file    Non-medical: Not on file  Tobacco Use  . Smoking  status: Never Smoker  . Smokeless tobacco: Never Used  Substance and Sexual Activity  . Alcohol use: No  . Drug use: No  . Sexual activity: Yes  Lifestyle  . Physical activity:    Days per week: Not on file    Minutes per session: Not on file  . Stress: Not on file  Relationships  . Social connections:    Talks on phone: Not on file    Gets together: Not on file    Attends religious service: Not on file    Active member of club or organization: Not on file    Attends meetings of clubs or organizations: Not on file    Relationship status: Not on file  . Intimate partner violence:    Fear of current or ex partner: Not on file    Emotionally abused: Not on file    Physically abused: Not on file    Forced sexual activity: Not on file  Other Topics Concern  . Not on file  Social History Narrative   Married, 2 children.   Lives in [place].   Educ: Masters   Personnel officer at [employer].    Outpatient Medications Prior to Visit  Medication Sig Dispense Refill  . fexofenadine (ALLEGRA) 180 MG tablet Take 1 tablet (180 mg total) by mouth daily. 30 tablet 6  . triamcinolone (NASACORT ALLERGY 24HR) 55 MCG/ACT AERO nasal inhaler Place 2 sprays into the nose daily. 1 Inhaler 6   No facility-administered medications prior to visit.     Allergies  Allergen Reactions  . Cucumber Extract Rash  . Mushroom Extract Complex Rash  . Peanut-Containing Drug Products Rash  . Soy Allergy Rash    ROS Review of Systems  Constitutional: Negative for appetite change, chills, fatigue and fever.  HENT: Negative for congestion, dental problem, ear pain and sore throat.   Eyes: Negative for discharge, redness and visual disturbance.  Respiratory: Negative for cough, chest tightness, shortness of breath and wheezing.   Cardiovascular: Negative for chest pain, palpitations and leg swelling.  Gastrointestinal: Negative for abdominal pain, blood in stool, diarrhea, nausea and vomiting.   Genitourinary: Negative for difficulty urinating, dysuria, flank pain, frequency, hematuria and urgency.  Musculoskeletal: Negative for arthralgias, back pain, joint swelling, myalgias and neck stiffness.  Skin: Negative for pallor and rash.  Neurological: Negative for dizziness, speech difficulty, weakness and headaches.  Hematological: Negative for adenopathy. Does not bruise/bleed easily.  Psychiatric/Behavioral: Negative for confusion and sleep disturbance. The patient is not nervous/anxious.     PE; Blood pressure 119/80, pulse 79, temperature 98.3 F (36.8 C), temperature source Oral, resp. rate 16, height 5' (1.524 m), weight 137 lb 2 oz (62.2 kg), last menstrual period 05/21/2018, SpO2 98 %. Body mass index is 26.78 kg/m. Pt examined with Helayne Seminole, CMA, as chaperone.  Gen: Alert, well appearing.  Patient is oriented to person, place, time, and situation. AFFECT: pleasant, lucid thought and speech. ENT: Ears: EACs clear, normal epithelium.  TMs with good light reflex and landmarks bilaterally.  Eyes: no injection, icteris, swelling, or exudate.  EOMI, PERRLA. Nose: no drainage or turbinate edema/swelling.  No injection or focal lesion.  Mouth: lips without lesion/swelling.  Oral mucosa pink and moist.  Dentition intact and without obvious caries or gingival swelling.  Oropharynx without erythema, exudate, or swelling.  Neck: supple/nontender.  No LAD, mass, or TM.  Carotid pulses 2+ bilaterally, without bruits. CV: RRR, no m/r/g.   LUNGS: CTA bilat, nonlabored resps, good aeration in all lung fields. ABD: soft, NT, ND, BS normal.  No hepatospenomegaly or mass.  No bruits. EXT: no clubbing, cyanosis, or edema.  Musculoskeletal: no joint swelling, erythema, warmth, or tenderness.  ROM of all joints intact. Skin - no sores or suspicious lesions or rashes or color changes   Pertinent labs:  Lab Results  Component Value Date   TSH 0.65 03/27/2017   Lab Results   Component Value Date   WBC 5.1 03/27/2017   HGB 13.0 03/27/2017   HCT 38 03/27/2017   MCV 92.8 08/04/2011   PLT 308 03/27/2017   Lab Results  Component Value Date   CREATININE 0.5 03/27/2017   BUN 11 03/27/2017   NA 139 03/27/2017   K 4.0 03/27/2017   Lab Results  Component Value Date   ALT 13 03/27/2017   AST 20 03/27/2017   ALKPHOS 45 03/27/2017   Lab Results  Component Value Date   CHOL 253 (A) 05/04/2018   Lab Results  Component Value Date   HDL 49 05/04/2018   Lab Results  Component Value Date   LDLCALC 181 05/04/2018   Lab Results  Component Value Date   TRIG 111 05/04/2018   No  results found for: CHOLHDL   ASSESSMENT AND PLAN:   Health maintenance exam: Reviewed age and gender appropriate health maintenance issues (prudent diet, regular exercise, health risks of tobacco and excessive alcohol, use of seatbelts, fire alarms in home, use of sunscreen).  Also reviewed age and gender appropriate health screening as well as vaccine recommendations. Vaccines:  UTD.  Flu vaccine-> pt declined b/c she gets this via employer. Labs: FLP, CBC w/diff, CMET, TSH, HbA1c (hx of gestation DM x 2, hx of mild IFG). Cervical ca screening: via GYN MD Dr. Radene Knee. Breast ca screening: GYN MD Dr. Radene Knee.  Mammo due 09/2018. Colon ca screening: due for initial screening colonoscopy-->ref to GI today.  An After Visit Summary was printed and given to the patient.  FOLLOW UP:  Return in about 1 year (around 06/02/2019) for annual CPE (fasting).  Signed:  [name], MD           06/01/2018

## 2018-06-03 ENCOUNTER — Encounter: Payer: Self-pay | Admitting: Family Medicine

## 2018-06-04 ENCOUNTER — Other Ambulatory Visit: Payer: Self-pay | Admitting: *Deleted

## 2018-06-04 DIAGNOSIS — E119 Type 2 diabetes mellitus without complications: Secondary | ICD-10-CM

## 2018-06-04 MED ORDER — ATORVASTATIN CALCIUM 40 MG PO TABS: 40.0000 mg | ORAL_TABLET | Freq: Every day | ORAL | 2 refills | Status: DC

## 2018-06-12 ENCOUNTER — Ambulatory Visit: Payer: Commercial Managed Care - PPO | Admitting: Allergy and Immunology

## 2018-07-02 ENCOUNTER — Encounter: Payer: Self-pay | Admitting: Family Medicine

## 2018-07-10 ENCOUNTER — Telehealth: Payer: Self-pay

## 2018-07-10 ENCOUNTER — Encounter: Payer: Self-pay | Admitting: Allergy and Immunology

## 2018-07-10 ENCOUNTER — Ambulatory Visit: Payer: Commercial Managed Care - PPO | Admitting: Allergy and Immunology

## 2018-07-10 VITALS — BP 112/76 | HR 100 | Resp 16 | Ht 60.0 in | Wt 137.0 lb

## 2018-07-10 DIAGNOSIS — R05 Cough: Secondary | ICD-10-CM | POA: Diagnosis not present

## 2018-07-10 DIAGNOSIS — T7800XD Anaphylactic reaction due to unspecified food, subsequent encounter: Secondary | ICD-10-CM

## 2018-07-10 DIAGNOSIS — L500 Allergic urticaria: Secondary | ICD-10-CM | POA: Diagnosis not present

## 2018-07-10 DIAGNOSIS — R059 Cough, unspecified: Secondary | ICD-10-CM | POA: Insufficient documentation

## 2018-07-10 DIAGNOSIS — J3089 Other allergic rhinitis: Secondary | ICD-10-CM | POA: Insufficient documentation

## 2018-07-10 DIAGNOSIS — T7800XA Anaphylactic reaction due to unspecified food, initial encounter: Secondary | ICD-10-CM | POA: Insufficient documentation

## 2018-07-10 MED ORDER — FLUTICASONE PROPIONATE 50 MCG/ACT NA SUSP: 2.0000 | Freq: Every day | NASAL | 5 refills | Status: DC

## 2018-07-10 MED ORDER — CARBINOXAMINE MALEATE 4 MG PO TABS
1.0000 | ORAL_TABLET | Freq: Three times a day (TID) | ORAL | 5 refills | Status: DC
Start: 2018-07-10 — End: 2019-04-15

## 2018-07-10 MED ORDER — EPINEPHRINE 0.3 MG/0.3ML IJ SOAJ: 0.3000 mg | Freq: Once | INTRAMUSCULAR | 2 refills | Status: AC

## 2018-07-10 MED ORDER — EPINEPHRINE 0.3 MG/0.3ML IJ SOAJ: 0.3000 mg | Freq: Once | INTRAMUSCULAR | 2 refills | Status: DC

## 2018-07-10 NOTE — Assessment & Plan Note (Addendum)
   Aeroallergen avoidance measures have been discussed and provided in written form.  A prescription has been provided for carbinoxamine 4 mg every 6-8 hours if needed.  A prescription has been provided for fluticasone nasal spray, one spray per nostril 1-2 times daily as needed. Proper nasal spray technique has been discussed and demonstrated.  Nasal saline spray (i.e., Simply Saline) or nasal saline lavage (i.e., NeilMed) is recommended as needed and prior to medicated nasal sprays.  For thick post nasal drainage, add guaifenesin 1200 mg (Mucinex Maximum Strength)  twice daily as needed with adequate hydration as discussed.  If allergen avoidance measures and medications fail to adequately relieve symptoms, aeroallergen immunotherapy will be considered.

## 2018-07-10 NOTE — Addendum Note (Signed)
Addended by: Valere Dross on: 07/10/2018 06:08 PM   Modules accepted: Orders

## 2018-07-10 NOTE — Assessment & Plan Note (Signed)
The patient's history and physical examination suggest upper airway cough syndrome.  Spirometry today reveals normal ventilatory function. We will aggressively treat postnasal drainage and evaluate results.  Treatment plan as outlined above.  If the coughing persists or progresses despite this plan, we will evaluate further. 

## 2018-07-10 NOTE — Telephone Encounter (Signed)
LMOM for patient to call back regarding other labs we need to order to mail to patient to get labs drawn. Orders were place for certain foods but patient had other food she wanted to get tested.

## 2018-07-10 NOTE — Progress Notes (Signed)
New Patient Note  RE: Tiffany Hicks MRN: [id number] DOB: [birth year] Date of Office Visit: 07/10/2018  Referring provider: Tammi Sou, MD Primary care provider: Tammi Sou, MD  Chief Complaint: Allergic Rhinitis ; Cough; and Allergic Reaction   History of present illness: Tiffany Hicks is a 47 y.o. female seen today in consultation requested by Shawnie Dapper, MD.  She reports that over the past 10 to 12 years she has experienced rash, headache, and diarrhea after certain meals.  The rash is described as small, red, itchy bumps.  She is unable to identify all of the specific foods with absolute certainty, however suspects peanuts, soy, cucumber, mushrooms, and sesame seed.  She does not experience angioedema or concomitant cardiopulmonary symptoms.  She reports that if she consumes a large portion of orange that she experiences generalized pruritus.  She is able to consume small amounts of orange without symptoms.  She does not experience concomitant cardiopulmonary or GI symptoms. She experiences nasal congestion, rhinorrhea, sneezing, postnasal drainage, throat irritation, coughing, and occasional sinus pressure.  These symptoms occur year-round but are more frequent and severe with pollen exposure.  The cough is described as originating in the base of her throat.  She has rare heartburn but the heartburn does not seem to correlate with cough.  She denies wheezing, dyspnea, and chest tightness.  Assessment and plan: Food allergy The patient's history suggests food allergy and positive skin test results today confirm this diagnosis.  Food allergen skin test was positive to cucumber.  There is a borderline positive reactivity to orange and rice, however she consumes rice on a regular basis without symptoms, therefore this represents a false positive result.  Avoidance of cucumber mushrooms, sesame seed, and orange as discussed.  A laboratory order form has been provided for serum  specific IgE against mushrooms, sesame seed, and alpha gal panel.  A prescription has been provided for epinephrine auto-injector 2 pack along with instructions for proper administration.  A food allergy action plan has been provided and discussed.  Medic Alert identification is recommended.  Seasonal and perennial allergic rhinitis  Aeroallergen avoidance measures have been discussed and provided in written form.  A prescription has been provided for carbinoxamine 4 mg every 6-8 hours if needed.  A prescription has been provided for fluticasone nasal spray, one spray per nostril 1-2 times daily as needed. Proper nasal spray technique has been discussed and demonstrated.  Nasal saline spray (i.e., Simply Saline) or nasal saline lavage (i.e., NeilMed) is recommended as needed and prior to medicated nasal sprays.  For thick post nasal drainage, add guaifenesin 1200 mg (Mucinex Maximum Strength)  twice daily as needed with adequate hydration as discussed.  If allergen avoidance measures and medications fail to adequately relieve symptoms, aeroallergen immunotherapy will be considered.  Coughing The patient's history and physical examination suggest upper airway cough syndrome.  Spirometry today reveals normal ventilatory function. We will aggressively treat postnasal drainage and evaluate results.  Treatment plan as outlined above.  If the coughing persists or progresses despite this plan, we will evaluate further.   Meds ordered this encounter  Medications  . Carbinoxamine Maleate 4 MG TABS    Sig: Take 1 tablet (4 mg total) by mouth every 8 (eight) hours.    Dispense:  28 each    Refill:  5  . fluticasone (FLONASE) 50 MCG/ACT nasal spray    Sig: Place 2 sprays into both nostrils daily.    Dispense:  16 g  Refill:  5  . EPINEPHrine (AUVI-Q) 0.3 mg/0.3 mL IJ SOAJ injection    Sig: Inject 0.3 mLs (0.3 mg total) into the muscle once for 1 dose. As directed for life-threatening  allergic reactions    Dispense:  4 Device    Refill:  2    Diagnostics: Spirometry: Spirometry:  Normal with an FEV1 of 84% predicted with an FEV1 ratio of 94%.  Please see scanned spirometry results for details. Environmental skin testing: Positive to grass pollen, weed pollen, tree pollen, and dust mite antigen. Food allergen skin testing: Positive to cucumber.  Borderline positive/equivocal to orange and rice.    Physical examination: Blood pressure 112/76, pulse 100, resp. rate 16, height 5' (1.524 m), weight 137 lb (62.1 kg), SpO2 99 %.  General: Alert, interactive, in no acute distress. HEENT: TMs pearly gray, turbinates moderately edematous with thick discharge, post-pharynx mildly erythematous. Neck: Supple without lymphadenopathy. Lungs: Clear to auscultation without wheezing, rhonchi or rales. CV: Normal S1, S2 without murmurs. Abdomen: Nondistended, nontender. Skin: Warm and dry, without lesions or rashes. Extremities:  No clubbing, cyanosis or edema. Neuro:   Grossly intact.  Review of systems:  Review of systems negative except as noted in HPI / PMHx or noted below: Review of Systems  Constitutional: Negative.   HENT: Negative.   Eyes: Negative.   Respiratory: Negative.   Cardiovascular: Negative.   Gastrointestinal: Negative.   Genitourinary: Negative.   Musculoskeletal: Negative.   Skin: Negative.   Neurological: Negative.   Endo/Heme/Allergies: Negative.   Psychiatric/Behavioral: Negative.     Past medical history:  Past Medical History:  Diagnosis Date  . Allergic rhinitis    Allergy testing approx 2012.  . Diabetes mellitus ([place]) 05/2018   Hba1c 6.5%  . Gestational diabetes    Both pregnancies.  . Hyperlipemia, mixed 05/2018   LDL 189; recommended statin.  . Left carpal tunnel syndrome   . Multiple thyroid nodules    FNA of one nodule: benign follic nodule/cyst on 01/12/98.  Needs f/u u/s 11/2018 for other nodules.    Past surgical history:    Past Surgical History:  Procedure Laterality Date  . BIOPSY THYROID Right 11/15/2017   benign.    . CESAREAN SECTION  08/03/2011   Procedure: CESAREAN SECTION;  Surgeon: Margarette Asal;  Location: [facility];  Service: Gynecology;  Laterality: N/A;    Family history: Family History  Problem Relation Age of Onset  . Hypertension Mother   . Diabetes Mother   . Lung cancer Mother        stage 4, non-smoker  . Hypertension Father   . Hyperlipidemia Father   . Diabetes Sister   . Hyperlipidemia Brother   . Diabetes Brother   . Diabetes Brother   . Hyperlipidemia Brother     Social history: Social History   Socioeconomic History  . Marital status: Married    Spouse name: Not on file  . Number of children: Not on file  . Years of education: Not on file  . Highest education level: Not on file  Occupational History  . Not on file  Social Needs  . Financial resource strain: Not on file  . Food insecurity:    Worry: Not on file    Inability: Not on file  . Transportation needs:    Medical: Not on file    Non-medical: Not on file  Tobacco Use  . Smoking status: Never Smoker  . Smokeless tobacco: Never Used  Substance and Sexual Activity  .  Alcohol use: No  . Drug use: No  . Sexual activity: Yes  Lifestyle  . Physical activity:    Days per week: Not on file    Minutes per session: Not on file  . Stress: Not on file  Relationships  . Social connections:    Talks on phone: Not on file    Gets together: Not on file    Attends religious service: Not on file    Active member of club or organization: Not on file    Attends meetings of clubs or organizations: Not on file    Relationship status: Not on file  . Intimate partner violence:    Fear of current or ex partner: Not on file    Emotionally abused: Not on file    Physically abused: Not on file    Forced sexual activity: Not on file  Other Topics Concern  . Not on file  Social History Narrative   Married, 2  children.   Lives in [place].   Educ: Masters   Personnel officer at [employer].   Environmental History: Patient lives in a house with hardwood floors throughout, gas heat, and central air.  There is no known mold/water damage in the home.  She is a non-smoker without pets.  Allergies as of 07/10/2018      Reactions   Cucumber Extract Rash   Mushroom Extract Complex Rash   Peanut-containing Drug Products Rash   Soy Allergy Rash      Medication List        Accurate as of 07/10/18  5:53 PM. Always use your most recent med list.          atorvastatin 40 MG tablet Commonly known as:  LIPITOR Take 1 tablet (40 mg total) by mouth daily.   Carbinoxamine Maleate 4 MG Tabs Take 1 tablet (4 mg total) by mouth every 8 (eight) hours.   EPINEPHrine 0.3 mg/0.3 mL Soaj injection Commonly known as:  EPI-PEN Inject 0.3 mLs (0.3 mg total) into the muscle once for 1 dose. As directed for life-threatening allergic reactions   fexofenadine 180 MG tablet Commonly known as:  ALLEGRA Take 1 tablet (180 mg total) by mouth daily.   fluticasone 50 MCG/ACT nasal spray Commonly known as:  FLONASE Place 2 sprays into both nostrils daily.   triamcinolone 55 MCG/ACT Aero nasal inhaler Commonly known as:  NASACORT Place 2 sprays into the nose daily.       Known medication allergies: Allergies  Allergen Reactions  . Cucumber Extract Rash  . Mushroom Extract Complex Rash  . Peanut-Containing Drug Products Rash  . Soy Allergy Rash    I appreciate the opportunity to take part in Dondra's care. Please do not hesitate to contact me with questions.  Sincerely,   R. Edgar Frisk, MD

## 2018-07-10 NOTE — Patient Instructions (Addendum)
Food allergy The patient's history suggests food allergy and positive skin test results today confirm this diagnosis.  Food allergen skin test was positive to cucumber.  There is a borderline positive reactivity to orange and rice, however she consumes rice on a regular basis without symptoms, therefore this represents a false positive result.  Avoidance of cucumber mushrooms, sesame seed, and orange as discussed.  A laboratory order form has been provided for serum specific IgE against mushrooms, sesame seed, and alpha gal panel.  A prescription has been provided for epinephrine auto-injector 2 pack along with instructions for proper administration.  A food allergy action plan has been provided and discussed.  Medic Alert identification is recommended.  Seasonal and perennial allergic rhinitis  Aeroallergen avoidance measures have been discussed and provided in written form.  A prescription has been provided for carbinoxamine 4 mg every 6-8 hours if needed.  A prescription has been provided for fluticasone nasal spray, one spray per nostril 1-2 times daily as needed. Proper nasal spray technique has been discussed and demonstrated.  Nasal saline spray (i.e., Simply Saline) or nasal saline lavage (i.e., NeilMed) is recommended as needed and prior to medicated nasal sprays.  For thick post nasal drainage, add guaifenesin 1200 mg (Mucinex Maximum Strength)  twice daily as needed with adequate hydration as discussed.  If allergen avoidance measures and medications fail to adequately relieve symptoms, aeroallergen immunotherapy will be considered.  Coughing The patient's history and physical examination suggest upper airway cough syndrome.  Spirometry today reveals normal ventilatory function. We will aggressively treat postnasal drainage and evaluate results.  Treatment plan as outlined above.  If the coughing persists or progresses despite this plan, we will evaluate further.   When  lab results have returned the patient will be called with further recommendations and follow up instructions.  Control of House Dust Mite Allergen  House dust mites play a major role in allergic asthma and rhinitis.  They occur in environments with high humidity wherever human skin, the food for dust mites is found. High levels have been detected in dust obtained from mattresses, pillows, carpets, upholstered furniture, bed covers, clothes and soft toys.  The principal allergen of the house dust mite is found in its feces.  A gram of dust may contain 1,000 mites and 250,000 fecal particles.  Mite antigen is easily measured in the air during house cleaning activities.    1. Encase mattresses, including the box spring, and pillow, in an air tight cover.  Seal the zipper end of the encased mattresses with wide adhesive tape. 2. Wash the bedding in water of 130 degrees Farenheit weekly.  Avoid cotton comforters/quilts and flannel bedding: the most ideal bed covering is the dacron comforter. 3. Remove all upholstered furniture from the bedroom. 4. Remove carpets, carpet padding, rugs, and non-washable window drapes from the bedroom.  Wash drapes weekly or use plastic window coverings. 5. Remove all non-washable stuffed toys from the bedroom.  Wash stuffed toys weekly. 6. Have the room cleaned frequently with a vacuum cleaner and a damp dust-mop.  The patient should not be in a room which is being cleaned and should wait 1 hour after cleaning before going into the room. 7. Close and seal all heating outlets in the bedroom.  Otherwise, the room will become filled with dust-laden air.  An electric heater can be used to heat the room. 8. Reduce indoor humidity to less than 50%.  Do not use a humidifier.  Reducing Pollen Exposure  The American Academy of Allergy, Asthma and Immunology suggests the following steps to reduce your exposure to pollen during allergy seasons.    1. Do not hang sheets or clothing  out to dry; pollen may collect on these items. 2. Do not mow lawns or spend time around freshly cut grass; mowing stirs up pollen. 3. Keep windows closed at night.  Keep car windows closed while driving. 4. Minimize morning activities outdoors, a time when pollen counts are usually at their highest. 5. Stay indoors as much as possible when pollen counts or humidity is high and on windy days when pollen tends to remain in the air longer. 6. Use air conditioning when possible.  Many air conditioners have filters that trap the pollen spores. 7. Use a HEPA room air filter to remove pollen form the indoor air you breathe.

## 2018-07-10 NOTE — Assessment & Plan Note (Signed)
The patient's history suggests food allergy and positive skin test results today confirm this diagnosis.  Food allergen skin test was positive to cucumber.  There is a borderline positive reactivity to orange and rice, however she consumes rice on a regular basis without symptoms, therefore this represents a false positive result.  Avoidance of cucumber mushrooms, sesame seed, and orange as discussed.  A laboratory order form has been provided for serum specific IgE against mushrooms, sesame seed, and alpha gal panel.  A prescription has been provided for epinephrine auto-injector 2 pack along with instructions for proper administration.  A food allergy action plan has been provided and discussed.  Medic Alert identification is recommended.

## 2018-07-12 ENCOUNTER — Ambulatory Visit: Payer: Commercial Managed Care - PPO | Admitting: Registered"

## 2018-07-31 ENCOUNTER — Ambulatory Visit: Payer: Commercial Managed Care - PPO | Admitting: Registered"

## 2018-08-08 ENCOUNTER — Encounter: Payer: Self-pay | Admitting: Family Medicine

## 2018-08-28 ENCOUNTER — Encounter: Payer: Self-pay | Admitting: Family Medicine

## 2018-09-04 ENCOUNTER — Ambulatory Visit: Payer: Commercial Managed Care - PPO | Admitting: Family Medicine

## 2018-09-04 ENCOUNTER — Encounter: Payer: Self-pay | Admitting: Family Medicine

## 2018-09-04 VITALS — BP 108/72 | HR 71 | Temp 98.5°F | Resp 16 | Ht 60.0 in | Wt 135.4 lb

## 2018-09-04 DIAGNOSIS — E042 Nontoxic multinodular goiter: Secondary | ICD-10-CM | POA: Diagnosis not present

## 2018-09-04 DIAGNOSIS — E119 Type 2 diabetes mellitus without complications: Secondary | ICD-10-CM | POA: Diagnosis not present

## 2018-09-04 DIAGNOSIS — E7800 Pure hypercholesterolemia, unspecified: Secondary | ICD-10-CM

## 2018-09-04 LAB — MICROALBUMIN / CREATININE URINE RATIO
CREATININE, U: 79.9 mg/dL
MICROALB UR: 1.3 mg/dL (ref 0.0–1.9)
Microalb Creat Ratio: 1.6 mg/g (ref 0.0–30.0)

## 2018-09-04 LAB — LIPID PANEL
Cholesterol: 251 mg/dL — ABNORMAL HIGH (ref 0–200)
HDL: 51.6 mg/dL (ref >39.00)
LDL Cholesterol: 178 mg/dL — ABNORMAL HIGH (ref 0–99)
NONHDL: 199.69
Total CHOL/HDL Ratio: 5
Triglycerides: 108 mg/dL (ref 0.0–149.0)
VLDL: 21.6 mg/dL (ref 0.0–40.0)

## 2018-09-04 LAB — HEMOGLOBIN A1C: HEMOGLOBIN A1C: 6.4 % (ref 4.6–6.5)

## 2018-09-04 NOTE — Progress Notes (Signed)
OFFICE VISIT  09/04/2018   CC:  Chief Complaint  Patient presents with  . Follow-up    RCI, pt is fasting.    HPI:    Patient is a 47 y.o. Asian female who presents for f/u new dx DM 2, mixed hyperlipidemia, and hx of multiple thyroid nodules.   DM: I referred her to nutritionist at last visit.  No meds started. Not much diet change since last visit.  Doesn't wan to go to nutritionist b/c too busy. Tries to walk at lunch. No burning, tingling, or numbness in feet.  HLD: I started her on atorva 40mg  qd last visit. Has been able to remember to take this only 2-3 days per week.  No side effects, though.  Thyroid nodules: she will need f/u thyroid u/s 11/2018 to recheck nodules.  ROS: no CP, no SOB, no wheezing, no cough, no dizziness, no HAs, no rashes, no melena/hematochezia.  No polyuria or polydipsia.  No myalgias or arthralgias.   Past Medical History:  Diagnosis Date  . Allergic rhinitis    Allergy testing approx 2012 and 2019.  . Diabetes mellitus ([place]) 05/2018   Hba1c 6.5%  . Food allergy    cucumber, mushroom, orange, sesame seed  . Gestational diabetes    Both pregnancies.  . Hyperlipemia, mixed 05/2018   LDL 189-->statin started.  . Left carpal tunnel syndrome   . Multiple thyroid nodules    FNA of one nodule: benign follic nodule/cyst on 03/17/19.  Needs f/u u/s 11/2018 for other nodules.    Past Surgical History:  Procedure Laterality Date  . BIOPSY THYROID Right 11/15/2017   benign.    . CESAREAN SECTION  08/03/2011   Procedure: CESAREAN SECTION;  Surgeon: Margarette Asal;  Location: [facility];  Service: Gynecology;  Laterality: N/A;    Outpatient Medications Prior to Visit  Medication Sig Dispense Refill  . atorvastatin (LIPITOR) 40 MG tablet Take 1 tablet (40 mg total) by mouth daily. 30 tablet 2  . Carbinoxamine Maleate 4 MG TABS Take 1 tablet (4 mg total) by mouth every 8 (eight) hours. 28 each 5  . fexofenadine (ALLEGRA) 180 MG tablet Take 1 tablet  (180 mg total) by mouth daily. 30 tablet 6  . fluticasone (FLONASE) 50 MCG/ACT nasal spray Place 2 sprays into both nostrils daily. 16 g 5  . triamcinolone (NASACORT ALLERGY 24HR) 55 MCG/ACT AERO nasal inhaler Place 2 sprays into the nose daily. (Patient not taking: Reported on 09/04/2018) 1 Inhaler 6   No facility-administered medications prior to visit.     Allergies  Allergen Reactions  . Cucumber Extract Rash  . Mushroom Extract Complex Rash  . Peanut-Containing Drug Products Rash  . Soy Allergy Rash    ROS As per HPI  PE: Blood pressure 108/72, pulse 71, temperature 98.5 F (36.9 C), temperature source Oral, resp. rate 16, height 5' (1.524 m), weight 135 lb 6 oz (61.4 kg), last menstrual period 09/04/2018, SpO2 98 %. Gen: Alert, well appearing.  Patient is oriented to person, place, time, and situation. AFFECT: pleasant, lucid thought and speech. Foot exam - bilateral normal; no swelling, tenderness or skin or vascular lesions. Color and temperature is normal. Sensation is intact. Peripheral pulses are palpable. Toenails are normal.   LABS:  Lab Results  Component Value Date   TSH 1.09 06/01/2018   Lab Results  Component Value Date   WBC 5.8 06/01/2018   HGB 13.5 06/01/2018   HCT 40.5 06/01/2018   MCV 91.0 06/01/2018  PLT 319.0 06/01/2018   Lab Results  Component Value Date   CREATININE 0.57 06/01/2018   BUN 11 06/01/2018   NA 137 06/01/2018   K 4.2 06/01/2018   CL 102 06/01/2018   CO2 31 06/01/2018   Lab Results  Component Value Date   ALT 23 06/01/2018   AST 15 06/01/2018   ALKPHOS 47 06/01/2018   BILITOT 0.5 06/01/2018   Lab Results  Component Value Date   CHOL 257 (H) 06/01/2018   Lab Results  Component Value Date   HDL 50.10 06/01/2018   Lab Results  Component Value Date   LDLCALC 189 (H) 06/01/2018   Lab Results  Component Value Date   TRIG 91.0 06/01/2018   Lab Results  Component Value Date   CHOLHDL 5 06/01/2018   Lab Results   Component Value Date   HGBA1C 6.5 06/01/2018    IMPRESSION AND PLAN:  1) DM 2, new dx 3 mo ago. She knows the dietary modifications well b/c she has hx of gestational DM. She decided to decline nutritionist consult. Check A1c.   Feet exam normal.   Urine microalb/cr check today. Pneumovax 23 to be given at her CPE in 6 mo. Marland Kitchen 2) HLD, mixed.  Tolerating statin but not very compliant. She will try to start taking this on a daily basis. Recheck FLP today.  3) Multiple thyroid nodules: has appt with her GYN MD, Dr. Radene Knee 10/2017 (Dr. Radene Knee did the initial thyroid u/s and ordered her bx that was done 11/2017) to f/u and if he doesn't order this she will call and I'll set it up.  An After Visit Summary was printed and given to the patient.  FOLLOW UP: Return in about 6 months (around 03/05/2019) for annual CPE (fasting).  Signed:  [name], MD           09/04/2018

## 2018-09-05 ENCOUNTER — Other Ambulatory Visit: Payer: Self-pay | Admitting: Family Medicine

## 2018-09-05 DIAGNOSIS — E7800 Pure hypercholesterolemia, unspecified: Secondary | ICD-10-CM

## 2018-09-05 MED ORDER — ATORVASTATIN CALCIUM 80 MG PO TABS: 80.0000 mg | ORAL_TABLET | Freq: Every day | ORAL | 1 refills | Status: DC

## 2018-10-10 DIAGNOSIS — E119 Type 2 diabetes mellitus without complications: Secondary | ICD-10-CM | POA: Diagnosis not present

## 2018-11-08 DIAGNOSIS — Z01419 Encounter for gynecological examination (general) (routine) without abnormal findings: Secondary | ICD-10-CM | POA: Diagnosis not present

## 2018-11-08 DIAGNOSIS — Z1231 Encounter for screening mammogram for malignant neoplasm of breast: Secondary | ICD-10-CM | POA: Diagnosis not present

## 2018-11-08 DIAGNOSIS — R319 Hematuria, unspecified: Secondary | ICD-10-CM | POA: Diagnosis not present

## 2018-11-08 DIAGNOSIS — Z6826 Body mass index (BMI) 26.0-26.9, adult: Secondary | ICD-10-CM | POA: Diagnosis not present

## 2018-11-08 LAB — HM MAMMOGRAPHY

## 2018-11-10 DIAGNOSIS — E119 Type 2 diabetes mellitus without complications: Secondary | ICD-10-CM | POA: Diagnosis not present

## 2018-11-29 DIAGNOSIS — R3121 Asymptomatic microscopic hematuria: Secondary | ICD-10-CM | POA: Diagnosis not present

## 2018-11-29 DIAGNOSIS — R319 Hematuria, unspecified: Secondary | ICD-10-CM | POA: Diagnosis not present

## 2018-12-05 ENCOUNTER — Emergency Department (HOSPITAL_COMMUNITY): Payer: Commercial Managed Care - PPO

## 2018-12-05 ENCOUNTER — Encounter (HOSPITAL_COMMUNITY): Payer: Self-pay | Admitting: Emergency Medicine

## 2018-12-05 ENCOUNTER — Other Ambulatory Visit: Payer: Self-pay

## 2018-12-05 ENCOUNTER — Emergency Department (HOSPITAL_COMMUNITY)
Admission: EM | Admit: 2018-12-05 | Discharge: 2018-12-05 | Disposition: A | Discharge status: Home / Self Care | Payer: Commercial Managed Care - PPO | Attending: Emergency Medicine | Admitting: Emergency Medicine

## 2018-12-05 DIAGNOSIS — Z91010 Allergy to peanuts: Secondary | ICD-10-CM | POA: Insufficient documentation

## 2018-12-05 DIAGNOSIS — E119 Type 2 diabetes mellitus without complications: Secondary | ICD-10-CM | POA: Diagnosis not present

## 2018-12-05 DIAGNOSIS — N201 Calculus of ureter: Secondary | ICD-10-CM | POA: Diagnosis not present

## 2018-12-05 DIAGNOSIS — R1031 Right lower quadrant pain: Secondary | ICD-10-CM | POA: Diagnosis not present

## 2018-12-05 DIAGNOSIS — R112 Nausea with vomiting, unspecified: Secondary | ICD-10-CM | POA: Diagnosis not present

## 2018-12-05 DIAGNOSIS — R1110 Vomiting, unspecified: Secondary | ICD-10-CM | POA: Diagnosis not present

## 2018-12-05 LAB — COMPREHENSIVE METABOLIC PANEL
ALT: 32 U/L (ref 0–44)
AST: 26 U/L (ref 15–41)
Albumin: 3.7 g/dL (ref 3.5–5.0)
Alkaline Phosphatase: 57 U/L (ref 38–126)
Anion gap: 12 (ref 5–15)
BUN: 13 mg/dL (ref 6–20)
CO2: 22 mmol/L (ref 22–32)
Calcium: 8.9 mg/dL (ref 8.9–10.3)
Chloride: 103 mmol/L (ref 98–111)
Creatinine, Ser: 0.82 mg/dL (ref 0.44–1.00)
GFR calc Af Amer: >60 mL/min (ref >60)
GFR calc non Af Amer: >60 mL/min (ref >60)
Glucose, Bld: 156 mg/dL — ABNORMAL HIGH (ref 70–99)
Potassium: 3.7 mmol/L (ref 3.5–5.1)
Sodium: 137 mmol/L (ref 135–145)
Total Bilirubin: 0.8 mg/dL (ref 0.3–1.2)
Total Protein: 7.4 g/dL (ref 6.5–8.1)

## 2018-12-05 LAB — URINALYSIS, ROUTINE W REFLEX MICROSCOPIC
Bilirubin Urine: NEGATIVE
Glucose, UA: NEGATIVE mg/dL
Ketones, ur: NEGATIVE mg/dL
Leukocytes,Ua: NEGATIVE
Nitrite: NEGATIVE
Protein, ur: 30 mg/dL — AB
Specific Gravity, Urine: 1.02 (ref 1.005–1.030)
pH: 8 (ref 5.0–8.0)

## 2018-12-05 LAB — CBC
HCT: 41.1 % (ref 36.0–46.0)
Hemoglobin: 13.1 g/dL (ref 12.0–15.0)
MCH: 29 pg (ref 26.0–34.0)
MCHC: 31.9 g/dL (ref 30.0–36.0)
MCV: 91.1 fL (ref 80.0–100.0)
PLATELETS: 326 10*3/uL (ref 150–400)
RBC: 4.51 MIL/uL (ref 3.87–5.11)
RDW: 12.6 % (ref 11.5–15.5)
WBC: 12.2 10*3/uL — ABNORMAL HIGH (ref 4.0–10.5)
nRBC: 0 % (ref 0.0–0.2)

## 2018-12-05 LAB — I-STAT BETA HCG BLOOD, ED (MC, WL, AP ONLY): I-stat hCG, quantitative: <5 m[IU]/mL (ref <5)

## 2018-12-05 LAB — LIPASE, BLOOD: Lipase: 36 U/L (ref 11–51)

## 2018-12-05 MED ORDER — OXYCODONE-ACETAMINOPHEN 5-325 MG PO TABS: 1.0000 | ORAL_TABLET | Freq: Four times a day (QID) | ORAL | 0 refills | Status: DC | PRN

## 2018-12-05 MED ORDER — ONDANSETRON HCL 4 MG/2ML IJ SOLN
4.0000 mg | Freq: Once | INTRAMUSCULAR | Status: AC
  Administered 2018-12-05: 4 mg via INTRAVENOUS
  Filled 2018-12-05: qty 2

## 2018-12-05 MED ORDER — HYDROMORPHONE HCL 1 MG/ML IJ SOLN
0.5000 mg | Freq: Once | INTRAMUSCULAR | Status: AC
  Administered 2018-12-05: 0.5 mg via INTRAVENOUS
  Filled 2018-12-05: qty 1

## 2018-12-05 MED ORDER — SODIUM CHLORIDE 0.9% FLUSH: 3.0000 mL | Freq: Once | INTRAVENOUS | Status: DC

## 2018-12-05 MED ORDER — MORPHINE SULFATE (PF) 4 MG/ML IV SOLN
4.0000 mg | Freq: Once | INTRAVENOUS | Status: AC
  Administered 2018-12-05: 4 mg via INTRAVENOUS
  Filled 2018-12-05: qty 1

## 2018-12-05 MED ORDER — KETOROLAC TROMETHAMINE 30 MG/ML IJ SOLN
30.0000 mg | Freq: Once | INTRAMUSCULAR | Status: AC
  Administered 2018-12-05: 30 mg via INTRAVENOUS
  Filled 2018-12-05: qty 1

## 2018-12-05 MED ORDER — TAMSULOSIN HCL 0.4 MG PO CAPS: 0.4000 mg | ORAL_CAPSULE | Freq: Every day | ORAL | 0 refills | Status: DC

## 2018-12-05 MED ORDER — IOHEXOL 300 MG/ML  SOLN
100.0000 mL | Freq: Once | INTRAMUSCULAR | Status: AC | PRN
  Administered 2018-12-05: 100 mL via INTRAVENOUS

## 2018-12-05 NOTE — ED Notes (Signed)
Patient verbalizes understanding of discharge instructions. Opportunity for questioning and answering were provided. Armband removed by staff , patient discharged from ED. 

## 2018-12-05 NOTE — ED Provider Notes (Signed)
[facility] EMERGENCY DEPARTMENT Provider Note   CSN: [id number] Arrival date & time: 12/05/18  0449    History   Chief Complaint Chief Complaint  Patient presents with  . Abdominal Pain    HPI Tiffany Hicks is a 48 y.o. female with a history of diabetes mellitus, hyperlipidemia, and allergic rhinitis who presents to the emergency department with a chief complaint of abdominal pain.  The patient endorses sudden onset right lower quadrant abdominal pain that began around 1 AM and awoke her from sleep around.  She states the pain seems to radiate to her right low back.  Pain has been constant since onset and cramping in nature.  Aggravating factors include positional changes.  No known alleviating factors.  She reports associated nausea and 3 episodes of nonbloody, nonbilious emesis and chills.   She denies fever, dysuria, urinary frequency or hesitancy, vaginal pain, itching, bleeding, or discharge, constipation, diarrhea, chest pain, shortness of breath.  No history of ovarian cyst or nephrolithiasis.     The history is provided by the patient. No language interpreter was used.    Past Medical History:  Diagnosis Date  . Allergic rhinitis    Allergy testing approx 2012 and 2019.  . Diabetes mellitus ([place]) 05/2018   Hba1c 6.5%  . Food allergy    cucumber, mushroom, orange, sesame seed  . Gestational diabetes    Both pregnancies.  . Hyperlipemia, mixed 05/2018   LDL 189-->statin started.  . Left carpal tunnel syndrome   . Multiple thyroid nodules    FNA of one nodule: benign follic nodule/cyst on 5/0/53.  Needs f/u u/s 11/2018 for other nodules.    Patient Active Problem List   Diagnosis Date Noted  . Food allergy 07/10/2018  . Seasonal and perennial allergic rhinitis 07/10/2018  . Coughing 07/10/2018  . Allergic urticaria 07/10/2018  . Dyspnea on exertion 03/27/2017  . FH: lung cancer 03/27/2017  . Left carpal tunnel syndrome 03/27/2017  .  Multiple thyroid nodules 03/27/2017  . Other headache syndrome 03/27/2017  . Calcaneal spur of right foot 01/08/2016  . Mixed hyperlipidemia 01/08/2016  . Overweight 01/08/2016    Past Surgical History:  Procedure Laterality Date  . BIOPSY THYROID Right 11/15/2017   benign.    . CESAREAN SECTION  08/03/2011   Procedure: CESAREAN SECTION;  Surgeon: Margarette Asal;  Location: [facility];  Service: Gynecology;  Laterality: N/A;     OB History    Gravida  2   Para  2   Term  2   Preterm  0   AB  0   Living  1     SAB  0   TAB  0   Ectopic  0   Multiple  0   Live Births               Home Medications    Prior to Admission medications   Medication Sig Start Date End Date Taking? Authorizing Provider  atorvastatin (LIPITOR) 80 MG tablet Take 1 tablet (80 mg total) by mouth daily. Patient not taking: Reported on 12/05/2018 09/05/18   Tammi Sou, MD  Carbinoxamine Maleate 4 MG TABS Take 1 tablet (4 mg total) by mouth every 8 (eight) hours. Patient not taking: Reported on 12/05/2018 07/10/18   Bobbitt, Sedalia Muta, MD  fexofenadine (ALLEGRA) 180 MG tablet Take 1 tablet (180 mg total) by mouth daily. Patient not taking: Reported on 12/05/2018 05/04/18 05/04/19  Tammi Sou, MD  fluticasone (  FLONASE) 50 MCG/ACT nasal spray Place 2 sprays into both nostrils daily. Patient not taking: Reported on 12/05/2018 07/10/18   Bobbitt, Sedalia Muta, MD    Family History Family History  Problem Relation Age of Onset  . Hypertension Mother   . Diabetes Mother   . Lung cancer Mother        stage 4, non-smoker  . Hypertension Father   . Hyperlipidemia Father   . Diabetes Sister   . Hyperlipidemia Brother   . Diabetes Brother   . Diabetes Brother   . Hyperlipidemia Brother     Social History Social History   Tobacco Use  . Smoking status: Never Smoker  . Smokeless tobacco: Never Used  Substance Use Topics  . Alcohol use: No  . Drug use: No      Allergies   Cucumber extract; Mushroom extract complex; Peanut-containing drug products; and Soy allergy   Review of Systems Review of Systems  Constitutional: Positive for chills. Negative for activity change and fever.  Respiratory: Negative for shortness of breath.   Cardiovascular: Negative for chest pain.  Gastrointestinal: Positive for abdominal pain, nausea and vomiting. Negative for anal bleeding, blood in stool, constipation and diarrhea.  Genitourinary: Positive for flank pain. Negative for dysuria, frequency, pelvic pain, urgency, vaginal bleeding, vaginal discharge and vaginal pain.  Musculoskeletal: Negative for arthralgias, back pain, myalgias and neck stiffness.  Skin: Negative for rash and wound.  Allergic/Immunologic: Negative for immunocompromised state.  Neurological: Negative for seizures, weakness, numbness and headaches.  Psychiatric/Behavioral: Negative for confusion.     Physical Exam Updated Vital Signs BP (!) 144/88   Pulse 77   Temp (!) 97.5 F (36.4 C) (Oral)   Resp 20   Ht 5' (1.524 m)   Wt 61.7 kg   LMP 11/10/2018 (Approximate)   SpO2 99%   BMI 26.56 kg/m   Physical Exam Vitals signs and nursing note reviewed.  Constitutional:      General: She is not in acute distress.    Comments: Uncomfortable appearing.   HENT:     Head: Normocephalic.  Eyes:     Conjunctiva/sclera: Conjunctivae normal.  Neck:     Musculoskeletal: Neck supple.  Cardiovascular:     Rate and Rhythm: Normal rate and regular rhythm.     Heart sounds: No murmur. No friction rub. No gallop.   Pulmonary:     Effort: Pulmonary effort is normal. No respiratory distress.     Breath sounds: No stridor. No wheezing, rhonchi or rales.  Chest:     Chest wall: No tenderness.  Abdominal:     General: Bowel sounds are normal. There is no distension.     Palpations: Abdomen is soft.     Tenderness: There is abdominal tenderness. There is guarding. There is no right CVA  tenderness, left CVA tenderness or rebound. Positive signs include McBurney's sign. Negative signs include Murphy's sign and Rovsing's sign.     Hernia: No hernia is present.     Comments: Tender to palpation in the right lower quadrant and suprapubic regions.  Maximal tenderness is located near McBurney's point.  She is guarding, but no rebound.  Negative Rovsing sign.  No CVA tenderness bilaterally.  Musculoskeletal:     Comments: Tender to palpation to the right lumbar region without midline tenderness.  No left-sided tenderness.  Thoracic and cervical regions are unremarkable.  Skin:    General: Skin is warm.     Findings: No rash.  Neurological:  Mental Status: She is alert.  Psychiatric:        Behavior: Behavior normal.      ED Treatments / Results  Labs (all labs ordered are listed, but only abnormal results are displayed) Labs Reviewed  COMPREHENSIVE METABOLIC PANEL - Abnormal; Notable for the following components:      Result Value   Glucose, Bld 156 (*)    All other components within normal limits  CBC - Abnormal; Notable for the following components:   WBC 12.2 (*)    All other components within normal limits  URINALYSIS, ROUTINE W REFLEX MICROSCOPIC - Abnormal; Notable for the following components:   APPearance CLOUDY (*)    Hgb urine dipstick LARGE (*)    Protein, ur 30 (*)    RBC / HPF >50 (*)    Bacteria, UA RARE (*)    All other components within normal limits  LIPASE, BLOOD  I-STAT BETA HCG BLOOD, ED ([facility], [facility], [facility] ONLY)    EKG None  Radiology No results found.  Procedures Procedures (including critical care time)  Medications Ordered in ED Medications  sodium chloride flush (NS) 0.9 % injection 3 mL (3 mLs Intravenous Not Given 12/05/18 0546)  HYDROmorphone (DILAUDID) injection 0.5 mg (has no administration in time range)  morphine 4 MG/ML injection 4 mg (4 mg Intravenous Given 12/05/18 0600)  ondansetron (ZOFRAN) injection 4 mg (4 mg Intravenous  Given 12/05/18 0600)     Initial Impression / Assessment and Plan / ED Course  I have reviewed the triage vital signs and the nursing notes.  Pertinent labs & imaging results that were available during my care of the patient were reviewed by me and considered in my medical decision making (see chart for details).        48 year old female with a history of diabetes mellitus, hyperlipidemia, and allergic rhinitis presenting with sudden onset right lower quadrant pain, chills, nausea, and vomiting.  On exam, she is tender in the right lower quadrant and right lumbar region, but does not have right CVA tenderness.  She appears uncomfortable.  Morphine given for pain control and Zofran for nausea.  Per chart review, she was seen over the last few weeks for hemoglobinuria and a urine culture was sent, but was unremarkable.  No history of nephrolithiasis.  Labs are notable for leukocytosis of 12.2 mild hyperglycemia of 156.  UA with hemoglobinuria and greater than 50 RBCs.  Amorphous crystals are also present.  Given her and history, suspect ureteral calculus, but since she is tender in the right lower quadrant will order CT A/P with contrast to assess her appendix.  Lower suspicion for TOA, ovarian torsion, pregnancy, pancreatitis, cholecystitis, diverticulitis at this time.  Patient care transferred to [place] at the end of my shift since the patient's CT is pending. Patient presentation, ED course, and plan of care discussed with review of all pertinent labs and imaging. Please see his/her note for further details regarding further ED course and disposition.   Final Clinical Impressions(s) / ED Diagnoses   Final diagnoses:  None    ED Discharge Orders    None       Bryssa Tones, Laymond Purser, PA-C 12/05/18 0713    Ward, Delice Bison, DO 12/05/18 0223

## 2018-12-05 NOTE — ED Triage Notes (Signed)
Pt reports having right sided on/off abd pains for the last month. Pt reports taking OTC meds with relief. Pt woke up this morning with worsening pain and did not have anymore medicine to take.

## 2018-12-05 NOTE — Discharge Instructions (Signed)
Return here as needed.  Follow-up with the urologist provided. 

## 2018-12-06 DIAGNOSIS — N201 Calculus of ureter: Secondary | ICD-10-CM | POA: Diagnosis not present

## 2018-12-09 DIAGNOSIS — E119 Type 2 diabetes mellitus without complications: Secondary | ICD-10-CM | POA: Diagnosis not present

## 2018-12-20 DIAGNOSIS — R8271 Bacteriuria: Secondary | ICD-10-CM | POA: Diagnosis not present

## 2018-12-20 DIAGNOSIS — N201 Calculus of ureter: Secondary | ICD-10-CM | POA: Diagnosis not present

## 2018-12-30 ENCOUNTER — Encounter: Payer: Self-pay | Admitting: Family Medicine

## 2019-01-09 DIAGNOSIS — E119 Type 2 diabetes mellitus without complications: Secondary | ICD-10-CM | POA: Diagnosis not present

## 2019-04-15 ENCOUNTER — Ambulatory Visit (INDEPENDENT_AMBULATORY_CARE_PROVIDER_SITE_OTHER): Payer: Commercial Managed Care - PPO | Admitting: Family Medicine

## 2019-04-15 ENCOUNTER — Other Ambulatory Visit: Payer: Self-pay

## 2019-04-15 ENCOUNTER — Ambulatory Visit: Payer: Self-pay

## 2019-04-15 ENCOUNTER — Encounter: Payer: Self-pay | Admitting: Family Medicine

## 2019-04-15 VITALS — Temp 99.6°F | Wt 144.4 lb

## 2019-04-15 DIAGNOSIS — M26629 Arthralgia of temporomandibular joint, unspecified side: Secondary | ICD-10-CM | POA: Diagnosis not present

## 2019-04-15 DIAGNOSIS — J029 Acute pharyngitis, unspecified: Secondary | ICD-10-CM

## 2019-04-15 DIAGNOSIS — H9201 Otalgia, right ear: Secondary | ICD-10-CM | POA: Diagnosis not present

## 2019-04-15 NOTE — Telephone Encounter (Signed)
Virtual appt today

## 2019-04-15 NOTE — Telephone Encounter (Signed)
Started having sore throat last week. And has right ear pain. Has had thyroid biopsy last year. No fever. Feels like her sore throat has to do with her thyroid. Warm transfer to [facility] in the practice.   Answer Assessment - Initial Assessment Questions 1. ONSET: "When did the throat start hurting?" (Hours or days ago)      Started last week 2. SEVERITY: "How bad is the sore throat?" (Scale 1-10; mild, moderate or severe)   - MILD (1-3):  doesn't interfere with eating or normal activities   - MODERATE (4-7): interferes with eating some solids and normal activities   - SEVERE (8-10):  excruciating pain, interferes with most normal activities   - SEVERE DYSPHAGIA: can't swallow liquids, drooling     Moderate - feels like food gets stuck 3. STREP EXPOSURE: "Has there been any exposure to strep within the past week?" If so, ask: "What type of contact occurred?"      No 4.  VIRAL SYMPTOMS: "Are there any symptoms of a cold, such as a runny nose, cough, hoarse voice or red eyes?"      Right era pain 5. FEVER: "Do you have a fever?" If so, ask: "What is your temperature, how was it measured, and when did it start?"     No 6. PUS ON THE TONSILS: "Is there pus on the tonsils in the back of your throat?"     No 7. OTHER SYMPTOMS: "Do you have any other symptoms?" (e.g., difficulty breathing, headache, rash)     Nothing 8. PREGNANCY: "Is there any chance you are pregnant?" "When was your last menstrual period?"     No  Protocols used: SORE THROAT-A-AH

## 2019-04-15 NOTE — Progress Notes (Signed)
Virtual Visit via Video Note  I connected with pt on 04/15/19 at  4:00 PM EDT by a video enabled telemedicine application and verified that I am speaking with the correct person using two identifiers.  Location patient: home Location provider:work or home office Persons participating in the virtual visit: patient, provider  I discussed the limitations of evaluation and management by telemedicine and the availability of in person appointments. The patient expressed understanding and agreed to proceed.  Telemedicine visit is a necessity given the COVID-19 restrictions in place at the current time.  HPI: 48 y/o female who is being seen today for sore throat and right ear pain. ST on and off x 6 mo. Started again most recently 10 d/a.  Right ear pressure, hurting, and decreased hearing mild. Has a popping, eustachian tube dysfxn sx's. Throat hurts only on the right side.  No nasal congestion or nasal mucous. When gets ST-->no fevers, no achiness, no fatigue. Opening mouth wide, such as when yawning, hurts TMJ area, R ear, and R side of throat.  ROS: See pertinent positives and negatives per HPI.  Past Medical History:  Diagnosis Date  . Allergic rhinitis    Allergy testing approx 2012 and 2019.  . Diabetes mellitus ([place]) 05/2018   Hba1c 6.5%  . Food allergy    cucumber, mushroom, orange, sesame seed  . Gestational diabetes    Both pregnancies.  . Hyperlipemia, mixed 05/2018   LDL 189-->statin started.  . Left carpal tunnel syndrome   . Multiple thyroid nodules    FNA of one nodule: benign follic nodule/cyst on 05/16/85.  Needs f/u u/s 11/2018 for other nodules.  . Right ureteral stone 11/2018   MET->urol f/u 12/2018->no impr->cont MET + pain med, discussion of ureteroscopy vs ESWL was done and pt considering.    Past Surgical History:  Procedure Laterality Date  . BIOPSY THYROID Right 11/15/2017   benign.    . CESAREAN SECTION  08/03/2011   Procedure: CESAREAN SECTION;  Surgeon:  Margarette Asal;  Location: [facility];  Service: Gynecology;  Laterality: N/A;    Family History  Problem Relation Age of Onset  . Hypertension Mother   . Diabetes Mother   . Lung cancer Mother        stage 4, non-smoker  . Hypertension Father   . Hyperlipidemia Father   . Diabetes Sister   . Hyperlipidemia Brother   . Diabetes Brother   . Diabetes Brother   . Hyperlipidemia Brother    Social hx: married, 2 kids, Chief Financial Officer at  [employer]   Current Outpatient Medications:  .  cetirizine (ZYRTEC ALLERGY) 10 MG tablet, Take 10 mg by mouth daily., Disp: , Rfl:   EXAM:  VITALS per patient if applicable: Temp 76.7 F (37.6 C) (Oral)   Wt 144 lb 6.4 oz (65.5 kg)   BMI 28.20 kg/m    GENERAL: alert, oriented, appears well and in no acute distress  HEENT: atraumatic, conjunttiva clear, no obvious abnormalities on inspection of external nose and ears Jaw opening is symmetric.  She pushes on R TMJ and says it is mildly tender.  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  She may additionally have some R eustachian tube dysfunction.  Discussed dx.  Reassured her. I recommended aleve 440 mg bid x 10d with food. I demonstrated TMJ strengthening/stability exercises to do regularly. I recommended use of saline nasal spray bid R nostril. I told her I would be happy to examine her in the office for this in the future if she wants. Pt expressed understanding and agreement with plan.  I discussed the assessment and treatment plan with  the patient. The patient was provided an opportunity to ask questions and all were answered. The patient agreed with the plan and demonstrated an understanding of the instructions.   The patient was advised to call back or seek an in-person evaluation if the symptoms worsen or if the condition fails to improve as anticipated.  F/u: prn  Signed:  [name], MD           04/15/2019

## 2019-06-05 ENCOUNTER — Encounter: Payer: Commercial Managed Care - PPO | Admitting: Family Medicine

## 2019-06-26 ENCOUNTER — Other Ambulatory Visit: Payer: Self-pay

## 2019-06-26 ENCOUNTER — Encounter: Payer: Self-pay | Admitting: Family Medicine

## 2019-06-26 ENCOUNTER — Ambulatory Visit (INDEPENDENT_AMBULATORY_CARE_PROVIDER_SITE_OTHER): Payer: Commercial Managed Care - PPO | Admitting: Family Medicine

## 2019-06-26 VITALS — BP 123/79 | HR 97 | Temp 98.1°F | Resp 16 | Ht 60.0 in | Wt 147.0 lb

## 2019-06-26 DIAGNOSIS — E119 Type 2 diabetes mellitus without complications: Secondary | ICD-10-CM | POA: Diagnosis not present

## 2019-06-26 DIAGNOSIS — Z23 Encounter for immunization: Secondary | ICD-10-CM

## 2019-06-26 DIAGNOSIS — G4733 Obstructive sleep apnea (adult) (pediatric): Secondary | ICD-10-CM | POA: Diagnosis not present

## 2019-06-26 DIAGNOSIS — E7800 Pure hypercholesterolemia, unspecified: Secondary | ICD-10-CM | POA: Diagnosis not present

## 2019-06-26 DIAGNOSIS — Z1211 Encounter for screening for malignant neoplasm of colon: Secondary | ICD-10-CM

## 2019-06-26 DIAGNOSIS — R400 Somnolence: Secondary | ICD-10-CM | POA: Diagnosis not present

## 2019-06-26 DIAGNOSIS — Z0001 Encounter for general adult medical examination with abnormal findings: Secondary | ICD-10-CM | POA: Diagnosis not present

## 2019-06-26 DIAGNOSIS — Z0000 Encounter for general adult medical examination without abnormal findings: Secondary | ICD-10-CM

## 2019-06-26 NOTE — Patient Instructions (Signed)
Health Maintenance, Female Adopting a healthy lifestyle and getting preventive care are important in promoting health and wellness. Ask your health care provider about:  The right schedule for you to have regular tests and exams.  Things you can do on your own to prevent diseases and keep yourself healthy. What should I know about diet, weight, and exercise? Eat a healthy diet   Eat a diet that includes plenty of vegetables, fruits, low-fat dairy products, and lean protein.  Do not eat a lot of foods that are high in solid fats, added sugars, or sodium. Maintain a healthy weight Body mass index (BMI) is used to identify weight problems. It estimates body fat based on height and weight. Your health care provider can help determine your BMI and help you achieve or maintain a healthy weight. Get regular exercise Get regular exercise. This is one of the most important things you can do for your health. Most adults should:  Exercise for at least 150 minutes each week. The exercise should increase your heart rate and make you sweat (moderate-intensity exercise).  Do strengthening exercises at least twice a week. This is in addition to the moderate-intensity exercise.  Spend less time sitting. Even light physical activity can be beneficial. Watch cholesterol and blood lipids Have your blood tested for lipids and cholesterol at 50 years of age, then have this test every 5 years. Have your cholesterol levels checked more often if:  Your lipid or cholesterol levels are high.  You are older than 50 years of age.  You are at high risk for heart disease. What should I know about cancer screening? Depending on your health history and family history, you may need to have cancer screening at various ages. This may include screening for:  Breast cancer.  Cervical cancer.  Colorectal cancer.  Skin cancer.  Lung cancer. What should I know about heart disease, diabetes, and high blood  pressure? Blood pressure and heart disease  High blood pressure causes heart disease and increases the risk of stroke. This is more likely to develop in people who have high blood pressure readings, are of African descent, or are overweight.  Have your blood pressure checked: ? Every 3-5 years if you are 18-39 years of age. ? Every year if you are 40 years old or older. Diabetes Have regular diabetes screenings. This checks your fasting blood sugar level. Have the screening done:  Once every three years after age 40 if you are at a normal weight and have a low risk for diabetes.  More often and at a younger age if you are overweight or have a high risk for diabetes. What should I know about preventing infection? Hepatitis B If you have a higher risk for hepatitis B, you should be screened for this virus. Talk with your health care provider to find out if you are at risk for hepatitis B infection. Hepatitis C Testing is recommended for:  Everyone born from 1945 through 1965.  Anyone with known risk factors for hepatitis C. Sexually transmitted infections (STIs)  Get screened for STIs, including gonorrhea and chlamydia, if: ? You are sexually active and are younger than 50 years of age. ? You are older than 50 years of age and your health care provider tells you that you are at risk for this type of infection. ? Your sexual activity has changed since you were last screened, and you are at increased risk for chlamydia or gonorrhea. Ask your health care provider if   you are at risk.  Ask your health care provider about whether you are at high risk for HIV. Your health care provider may recommend a prescription medicine to help prevent HIV infection. If you choose to take medicine to prevent HIV, you should first get tested for HIV. You should then be tested every 3 months for as long as you are taking the medicine. Pregnancy  If you are about to stop having your period (premenopausal) and  you may become pregnant, seek counseling before you get pregnant.  Take 400 to 800 micrograms (mcg) of folic acid every day if you become pregnant.  Ask for birth control (contraception) if you want to prevent pregnancy. Osteoporosis and menopause Osteoporosis is a disease in which the bones lose minerals and strength with aging. This can result in bone fractures. If you are 65 years old or older, or if you are at risk for osteoporosis and fractures, ask your health care provider if you should:  Be screened for bone loss.  Take a calcium or vitamin D supplement to lower your risk of fractures.  Be given hormone replacement therapy (HRT) to treat symptoms of menopause. Follow these instructions at home: Lifestyle  Do not use any products that contain nicotine or tobacco, such as cigarettes, e-cigarettes, and chewing tobacco. If you need help quitting, ask your health care provider.  Do not use street drugs.  Do not share needles.  Ask your health care provider for help if you need support or information about quitting drugs. Alcohol use  Do not drink alcohol if: ? Your health care provider tells you not to drink. ? You are pregnant, may be pregnant, or are planning to become pregnant.  If you drink alcohol: ? Limit how much you use to 0-1 drink a day. ? Limit intake if you are breastfeeding.  Be aware of how much alcohol is in your drink. In the [place] one drink equals one 12 oz bottle of beer (355 mL), one 5 oz glass of wine (148 mL), or one 1 oz glass of hard liquor (44 mL). General instructions  Schedule regular health, dental, and eye exams.  Stay current with your vaccines.  Tell your health care provider if: ? You often feel depressed. ? You have ever been abused or do not feel safe at home. Summary  Adopting a healthy lifestyle and getting preventive care are important in promoting health and wellness.  Follow your health care provider's instructions about healthy  diet, exercising, and getting tested or screened for diseases.  Follow your health care provider's instructions on monitoring your cholesterol and blood pressure. This information is not intended to replace advice given to you by your health care provider. Make sure you discuss any questions you have with your health care provider. Document Released: 04/11/2011 Document Revised: 09/19/2018 Document Reviewed: 09/19/2018 Elsevier Patient Education  2020 Elsevier Inc.  

## 2019-06-26 NOTE — Addendum Note (Signed)
Addended by: Marlene Bast A on: 06/26/2019 01:54 PM   Modules accepted: Orders

## 2019-06-26 NOTE — Progress Notes (Addendum)
Office Note 06/26/2019  CC:  Chief Complaint  Patient presents with  . Annual Exam    pt is not fasting    HPI:  Tiffany Hicks is a 48 y.o. Asian female who is here accompanied by her husband for annual health maintenance exam.  Exercise: she does try to walk regularly. Diabetic diet is "fair". Avg home glucose is around 100.  Last visit 08/2018 I increased her atorvastatin to 11m qd.  She did not return for lab visit to check repeat FLP as instructed .  She had some generalized pain so she stopped it. Dx'd with right ureteral stone 12/05/18, pt chose medical expulsive therapy and she is not sure if it has passed, but does say she hasn't had pain in flank or groin in about a month.  No further blood in urine.  Has initial consult appt with urol in late oct this year.  Husband says she snores, has apneic spells in sleep, has excessive daytime somnolence. Wonders about referral for evaluation of OSA.  Past Medical History:  Diagnosis Date  . Allergic rhinitis    Allergy testing approx 2012 and 2019.  . Diabetes mellitus ([facility] 05/2018   Hba1c 6.5%  . Food allergy    cucumber, mushroom, orange, sesame seed  . Gestational diabetes    Both pregnancies.  . Hyperlipemia, mixed 05/2018   LDL 189-->statin started.  . Left carpal tunnel syndrome   . Multiple thyroid nodules    FNA of one nodule: benign follic nodule/cyst on 24/0/81  Needs f/u u/s 11/2018 for other nodules.  . Right ureteral stone 11/2018   MET->urol f/u 12/2018->no impr->cont MET + pain med, discussion of ureteroscopy vs ESWL was done and pt considering.    Past Surgical History:  Procedure Laterality Date  . BIOPSY THYROID Right 11/15/2017   benign.    . CESAREAN SECTION  08/03/2011   Procedure: CESAREAN SECTION;  Surgeon: RMargarette Asal  [facility];  Service: Gynecology;  Laterality: N/A;    Family History  Problem Relation Age of Onset  . Hypertension Mother   . Diabetes Mother   . Lung cancer  Mother        stage 4, non-smoker  . Hypertension Father   . Hyperlipidemia Father   . Diabetes Sister   . Hyperlipidemia Brother   . Diabetes Brother   . Diabetes Brother   . Hyperlipidemia Brother     Social History   Socioeconomic History  . Marital status: Married    Spouse name: Not on file  . Number of children: Not on file  . Years of education: Not on file  . Highest education level: Not on file  Occupational History  . Not on file  Social Needs  . Financial resource strain: Not on file  . Food insecurity    Worry: Not on file    Inability: Not on file  . Transportation needs    Medical: Not on file    Non-medical: Not on file  Tobacco Use  . Smoking status: Never Smoker  . Smokeless tobacco: Never Used  Substance and Sexual Activity  . Alcohol use: No  . Drug use: No  . Sexual activity: Yes  Lifestyle  . Physical activity    Days per week: Not on file    Minutes per session: Not on file  . Stress: Not on file  Relationships  . Social [place] phone: Not on file    Gets together:  Not on file    Attends religious service: Not on file    Active member of club or organization: Not on file    Attends meetings of clubs or organizations: Not on file    Relationship status: Not on file  . Intimate partner violence    Fear of current or ex partner: Not on file    Emotionally abused: Not on file    Physically abused: Not on file    Forced sexual activity: Not on file  Other Topics Concern  . Not on file  Social History Narrative   Married, 2 children.   Lives in [place].   Educ: Masters   Personnel officer at [employer].    Outpatient Medications Prior to Visit  Medication Sig Dispense Refill  . cetirizine (ZYRTEC ALLERGY) 10 MG tablet Take 10 mg by mouth daily.     No facility-administered medications prior to visit.     Allergies  Allergen Reactions  . Cucumber Extract Rash  . Mushroom Extract Complex Rash  . Peanut-Containing  Drug Products Rash  . Soy Allergy Rash    ROS Review of Systems  Constitutional: Negative for appetite change, chills, fatigue and fever.  HENT: Negative for congestion, dental problem, ear pain and sore throat.   Eyes: Negative for discharge, redness and visual disturbance.  Respiratory: Negative for cough, chest tightness, shortness of breath and wheezing.   Cardiovascular: Negative for chest pain, palpitations and leg swelling.  Gastrointestinal: Negative for abdominal pain, blood in stool, diarrhea, nausea and vomiting.  Genitourinary: Negative for difficulty urinating, dysuria, flank pain, frequency, hematuria and urgency.  Musculoskeletal: Negative for arthralgias, back pain, joint swelling, myalgias and neck stiffness.  Skin: Negative for pallor and rash.  Neurological: Negative for dizziness, speech difficulty, weakness and headaches.  Hematological: Negative for adenopathy. Does not bruise/bleed easily.  Psychiatric/Behavioral: Positive for sleep disturbance (see hpi). Negative for confusion. The patient is not nervous/anxious.     PE; Blood pressure 123/79, pulse 97, temperature 98.1 F (36.7 C), temperature source Temporal, resp. rate 16, height 5' (1.524 m), weight 147 lb (66.7 kg), SpO2 96 %. Body mass index is 28.71 kg/m.  Gen: Alert, well appearing.  Patient is oriented to person, place, time, and situation. AFFECT: pleasant, lucid thought and speech. ENT: Ears: EACs clear, normal epithelium.  TMs with good light reflex and landmarks bilaterally.  Eyes: no injection, icteris, swelling, or exudate.  EOMI, PERRLA. Nose: no drainage or turbinate edema/swelling.  No injection or focal lesion.  Mouth: lips without lesion/swelling.  Oral mucosa pink and moist.  Dentition intact and without obvious caries or gingival swelling.  Oropharynx without erythema, exudate, or swelling.  Neck: supple/nontender.  No LAD, mass, or TM.  Carotid pulses 2+ bilaterally, without bruits. CV:  RRR, no m/r/g.   LUNGS: CTA bilat, nonlabored resps, good aeration in all lung fields. ABD: soft, NT, ND, BS normal.  No hepatospenomegaly or mass.  No bruits. EXT: no clubbing, cyanosis, or edema.  Musculoskeletal: no joint swelling, erythema, warmth, or tenderness.  ROM of all joints intact. Skin - no sores or suspicious lesions or rashes or color changes   Pertinent labs:  Lab Results  Component Value Date   TSH 1.09 06/01/2018   Lab Results  Component Value Date   WBC 12.2 (H) 12/05/2018   HGB 13.1 12/05/2018   HCT 41.1 12/05/2018   MCV 91.1 12/05/2018   PLT 326 12/05/2018   Lab Results  Component Value Date   CREATININE  0.82 12/05/2018   BUN 13 12/05/2018   NA 137 12/05/2018   K 3.7 12/05/2018   CL 103 12/05/2018   CO2 22 12/05/2018   Lab Results  Component Value Date   ALT 32 12/05/2018   AST 26 12/05/2018   ALKPHOS 57 12/05/2018   BILITOT 0.8 12/05/2018   Lab Results  Component Value Date   CHOL 251 (H) 09/04/2018   Lab Results  Component Value Date   HDL 51.60 09/04/2018   Lab Results  Component Value Date   LDLCALC 178 (H) 09/04/2018   Lab Results  Component Value Date   TRIG 108.0 09/04/2018   Lab Results  Component Value Date   CHOLHDL 5 09/04/2018   Lab Results  Component Value Date   HGBA1C 6.4 09/04/2018    ASSESSMENT AND PLAN:   1) OSA suspected: refer to sleep MD at [facility]. Quality of sleep questionnaire today, score of 13= very high risk of OSA.  2) DM 2: good control per home monitoring. Diet controlled. Needs to improve diet and increase exercise. HbA1c future.  Urine microalb/cr next f/u visit in 3 mo.  3) Hyperlipidemia: stopped 80 mg dose of lipitor b/c felt bad but also wasn't sure if it had anything to do with her getting a kidney stone. Reassured her that it was not likely related to her getting kidney stone. However, hold off on restart of atorva until urol eval.  If stone gone then restart  atorva 1/2 of 54m tab qd.  IF doesn't tolerate this dosing then call and report it to me.  If tolerates this dose then increase to 80 mg qd dosing after 2-3 wks.  4) Health maintenance exam: Reviewed age and gender appropriate health maintenance issues (prudent diet, regular exercise, health risks of tobacco and excessive alcohol, use of seatbelts, fire alarms in home, use of sunscreen).  Also reviewed age and gender appropriate health screening as well as vaccine recommendations. Vaccines: flu vaccine->given today.   Pneumovax 23-->given today.   Shingrix--->#1 given today. Labs: return for fasting lab visit--> health panel + Hba1c (DM2). Cervical ca screening: Dr. MMickle Plumbwas done in early 2020--normal per pt report. Breast ca screening: 09/2017->neg (Dr. MRadene Knee.  Repeat early 2020-->normal per pt report.. Colon ca screening: due for initial screening colonoscopy-->she defers this until next year.  An After Visit Summary was printed and given to the patient.  FOLLOW UP:  Return in about 3 months (around 09/25/2019) for routine chronic illness f/u.  Signed:  PCrissie Sickles MD           06/26/2019

## 2019-06-28 ENCOUNTER — Ambulatory Visit: Payer: Self-pay

## 2019-06-28 NOTE — Telephone Encounter (Signed)
Pts husband called back and stated his wife is doing much better today. She was feeling weak and fever since immunizations. Nobody else sick in the home. Husband states he will call if he feels she needs a VV or take to urgent care over the weekend. Husband was given information for COVID testing sites, covid symptoms to watch for, and was told to go to hospital if wife experiences SOB. He verbalizes understanding.

## 2019-06-28 NOTE — Telephone Encounter (Signed)
Patient's husband okay to wait till Monday to see if SXS are improving?  Please advise.

## 2019-06-28 NOTE — Telephone Encounter (Signed)
Patient husband was speaking with triage nurse as was disconnected. He said that per protocol from triage nurse patient needed to be seen by PCP.   Sent to [facility] clinical team to follow up with patient  Please call [phone]

## 2019-06-28 NOTE — Telephone Encounter (Signed)
Reassure pt/husband that this is a side effect of the shingles vaccine and it is pretty common. She should definitely be feeling back to normal tomorrow or the next day at the latest.-thx

## 2019-06-28 NOTE — Telephone Encounter (Signed)
Incoming call from Patient Husband and Pt. States that Patient had shingles , flu and pneumonia shot on Wednesday.   Reports fever of 103, aching at injection sites  And swelling.   Has 3 layers of blankets on patient and heater. Reviewed protocol with Patient recommended Pt be evaluated at PCP in 4 hours.   Transferred  call to office .  Sx started yesterday moderate to severe.                Reason for Disposition . [1] Redness or red streak around the injection site AND [2] begins > 48 hours after shot AND [3] fever  Answer Assessment - Initial Assessment Questions 1. SYMPTOMS: "What is the main symptom?" (e.g., redness, swelling, pain)    Chills pain from shots swelling  2. ONSET: "When was the vaccine (shot) given?" "How much later did the *No Answer* begin?" (e.g., hours, days ago)     Sx started yesterday.   3. SEVERITY: "How bad is it?"      Moderate and severe 4. FEVER: "Is there a fever?" If so, ask: "What is it, how was it measured, and when did it start?"      103 5. IMMUNIZATIONS GIVEN: "What shots have you recently received?"     Shingle,  flu , pnemonia 6. PAST REACTIONS: "Have you reacted to immunizations before?" If so, ask: "What happened?"     denies 7. OTHER SYMPTOMS: "Do you have any other symptoms?"     soreness  Protocols used: IMMUNIZATION REACTIONS-A-AH

## 2019-06-28 NOTE — Telephone Encounter (Signed)
Patient aware, voiced understanding.

## 2019-07-01 ENCOUNTER — Ambulatory Visit (INDEPENDENT_AMBULATORY_CARE_PROVIDER_SITE_OTHER): Payer: Commercial Managed Care - PPO | Admitting: Family Medicine

## 2019-07-01 ENCOUNTER — Other Ambulatory Visit: Payer: Self-pay

## 2019-07-01 DIAGNOSIS — E7800 Pure hypercholesterolemia, unspecified: Secondary | ICD-10-CM | POA: Diagnosis not present

## 2019-07-01 DIAGNOSIS — E119 Type 2 diabetes mellitus without complications: Secondary | ICD-10-CM

## 2019-07-01 LAB — CBC WITH DIFFERENTIAL/PLATELET
Basophils Absolute: 0 10*3/uL (ref 0.0–0.1)
Basophils Relative: 0.9 % (ref 0.0–3.0)
Eosinophils Absolute: 0.2 10*3/uL (ref 0.0–0.7)
Eosinophils Relative: 3.3 % (ref 0.0–5.0)
HCT: 38.4 % (ref 36.0–46.0)
Hemoglobin: 12.9 g/dL (ref 12.0–15.0)
Lymphocytes Relative: 37.6 % (ref 12.0–46.0)
Lymphs Abs: 1.7 10*3/uL (ref 0.7–4.0)
MCHC: 33.6 g/dL (ref 30.0–36.0)
MCV: 90.1 fl (ref 78.0–100.0)
Monocytes Absolute: 0.4 10*3/uL (ref 0.1–1.0)
Monocytes Relative: 8.9 % (ref 3.0–12.0)
Neutro Abs: 2.3 10*3/uL (ref 1.4–7.7)
Neutrophils Relative %: 49.3 % (ref 43.0–77.0)
Platelets: 355 10*3/uL (ref 150.0–400.0)
RBC: 4.26 Mil/uL (ref 3.87–5.11)
RDW: 13.5 % (ref 11.5–15.5)
WBC: 4.6 10*3/uL (ref 4.0–10.5)

## 2019-07-01 LAB — COMPREHENSIVE METABOLIC PANEL
ALT: 18 U/L (ref 0–35)
AST: 15 U/L (ref 0–37)
Albumin: 3.8 g/dL (ref 3.5–5.2)
Alkaline Phosphatase: 50 U/L (ref 39–117)
BUN: 12 mg/dL (ref 6–23)
CO2: 29 mEq/L (ref 19–32)
Calcium: 9.3 mg/dL (ref 8.4–10.5)
Chloride: 105 mEq/L (ref 96–112)
Creatinine, Ser: 0.54 mg/dL (ref 0.40–1.20)
GFR: 119.36 mL/min (ref >60.00)
Glucose, Bld: 103 mg/dL — ABNORMAL HIGH (ref 70–99)
Potassium: 4.5 mEq/L (ref 3.5–5.1)
Sodium: 140 mEq/L (ref 135–145)
Total Bilirubin: 0.4 mg/dL (ref 0.2–1.2)
Total Protein: 7.1 g/dL (ref 6.0–8.3)

## 2019-07-01 LAB — TSH: TSH: 1.1 u[IU]/mL (ref 0.35–4.50)

## 2019-07-01 LAB — LIPID PANEL
Cholesterol: 241 mg/dL — ABNORMAL HIGH (ref 0–200)
HDL: 42.8 mg/dL (ref >39.00)
LDL Cholesterol: 180 mg/dL — ABNORMAL HIGH (ref 0–99)
NonHDL: 198.35
Total CHOL/HDL Ratio: 6
Triglycerides: 91 mg/dL (ref 0.0–149.0)
VLDL: 18.2 mg/dL (ref 0.0–40.0)

## 2019-07-01 LAB — HEMOGLOBIN A1C: Hgb A1c MFr Bld: 7 % — ABNORMAL HIGH (ref 4.6–6.5)

## 2019-07-24 ENCOUNTER — Telehealth: Payer: Self-pay | Admitting: Family Medicine

## 2019-07-24 NOTE — Telephone Encounter (Signed)
Called patient and spoke to her husband. According to a note from Dr. Anitra Lauth, patient was supposed to call Dr. Radene Knee and follow up with him on the referral. I advised that if Dr. Radene Knee doesn't want to order the referral to call back and I will send Dr. Anitra Lauth a message about ordering the referral. Patient verbalized understanding.

## 2019-07-24 NOTE — Telephone Encounter (Signed)
Is trying to get in touch with thyroid doctor that Dr. Anitra Lauth referred to patient. I could not find any details in appt history.  Please call [phone]

## 2019-07-31 ENCOUNTER — Other Ambulatory Visit: Payer: Self-pay | Admitting: Urology

## 2019-08-01 ENCOUNTER — Inpatient Hospital Stay (HOSPITAL_COMMUNITY): Admission: RE | Admit: 2019-08-01 | Payer: Commercial Managed Care - PPO | Source: Ambulatory Visit

## 2019-08-01 ENCOUNTER — Other Ambulatory Visit: Payer: Self-pay

## 2019-08-01 DIAGNOSIS — Z20822 Contact with and (suspected) exposure to covid-19: Secondary | ICD-10-CM

## 2019-08-01 NOTE — Patient Instructions (Addendum)
DUE TO COVID-19 ONLY ONE VISITOR IS ALLOWED TO COME WITH YOU AND STAY IN THE WAITING ROOM ONLY DURING PRE OP AND PROCEDURE. THE ONE VISITOR MAY VISIT WITH YOU IN YOUR PRIVATE ROOM DURING VISITING HOURS ONLY!!   COVID SWAB TESTING COMPLETED ON:   Thursday, Oct. 22, 2020 (Must self quarantine after testing. Follow instructions on handout.)             Your procedure is scheduled on: Monday, Oct. 26, 2020   Report to [facility] Main  Entrance    Report to admitting at 10:45 AM   Call this number if you have problems the morning of surgery [phone]   Do not eat food or drink liquids :After Midnight.   Brush your teeth the morning of surgery.   Do NOT smoke after Midnight   Take these medicines the morning of surgery with A SIP OF WATER: None  DO NOT TAKE ANY DIABETIC MEDICATIONS DAY OF YOUR SURGERY                               You may not have any metal on your body including hair pins, jewelry, and body piercings             Do not wear make-up, lotions, powders, perfumes/cologne, or deodorant             Do not wear nail polish.  Do not shave  48 hours prior to surgery.              Do not bring valuables to the hospital. [employer].   Contacts, dentures or bridgework may not be worn into surgery.    Patients discharged the day of surgery will not be allowed to drive home.   Special Instructions: Bring a copy of your healthcare power of attorney and living will documents         the day of surgery if you haven't scanned them in before.              Please read over the following fact sheets you were given:  How to Manage Your Diabetes Before and After Surgery  Why is it important to control my blood sugar before and after surgery? . Improving blood sugar levels before and after surgery helps healing and can limit problems. . A way of improving blood sugar control is eating a healthy diet by: o  Eating less sugar  and carbohydrates o  Increasing activity/exercise o  Talking with your doctor about reaching your blood sugar goals . High blood sugars (greater than 180 mg/dL) can raise your risk of infections and slow your recovery, so you will need to focus on controlling your diabetes during the weeks before surgery. . Make sure that the doctor who takes care of your diabetes knows about your planned surgery including the date and location.  How do I manage my blood sugar before surgery? . Check your blood sugar at least 4 times a day, starting 2 days before surgery, to make sure that the level is not too high or low. o Check your blood sugar the morning of your surgery when you wake up and every 2 hours until you get to the Short Stay unit. . If your blood sugar is less than 70 mg/dL, you will need to  treat for low blood sugar: o Do not take insulin. o Treat a low blood sugar (less than 70 mg/dL) with  cup of clear juice (cranberry or apple), 4 glucose tablets, OR glucose gel. o Recheck blood sugar in 15 minutes after treatment (to make sure it is greater than 70 mg/dL). If your blood sugar is not greater than 70 mg/dL on recheck, call [phone] for further instructions. . Report your blood sugar to the short stay nurse when you get to Short Stay.  . If you are admitted to the hospital after surgery: o Your blood sugar will be checked by the staff and you will probably be given insulin after surgery (instead of oral diabetes medicines) to make sure you have good blood sugar levels. o The goal for blood sugar control after surgery is 80-180 mg/dL.   WHAT DO I DO ABOUT MY DIABETES MEDICATION?  DIET CONTROLLED    Patient Signature:  Date:   Nurse Signature:  Date:   Reviewed and Endorsed by [facility] Patient Education Committee, August 2015                   [facility] - Preparing for Surgery Before surgery, you can play an important role.  Because skin is not sterile, your skin needs to be  as free of germs as possible.  You can reduce the number of germs on your skin by washing with CHG (chlorahexidine gluconate) soap before surgery.  CHG is an antiseptic cleaner which kills germs and bonds with the skin to continue killing germs even after washing. Please DO NOT use if you have an allergy to CHG or antibacterial soaps.  If your skin becomes reddened/irritated stop using the CHG and inform your nurse when you arrive at Short Stay. Do not shave (including legs and underarms) for at least 48 hours prior to the first CHG shower.  You may shave your face/neck.  Please follow these instructions carefully:  1.  Shower with CHG Soap the night before surgery and the  morning of surgery.  2.  If you choose to wash your hair, wash your hair first as usual with your normal  shampoo.  3.  After you shampoo, rinse your hair and body thoroughly to remove the shampoo.                             4.  Use CHG as you would any other liquid soap.  You can apply chg directly to the skin and wash.  Gently with a scrungie or clean washcloth.  5.  Apply the CHG Soap to your body ONLY FROM THE NECK DOWN.   Do   not use on face/ open                           Wound or open sores. Avoid contact with eyes, ears mouth and   genitals (private parts).                       Wash face,  Genitals (private parts) with your normal soap.             6.  Wash thoroughly, paying special attention to the area where your    surgery  will be performed.  7.  Thoroughly rinse your body with warm water from the neck down.  8.  DO NOT shower/wash with  your normal soap after using and rinsing off the CHG Soap.                9.  Pat yourself dry with a clean towel.            10.  Wear clean pajamas.            11.  Place clean sheets on your bed the night of your first shower and do not  sleep with pets. Day of Surgery : Do not apply any lotions/deodorants the morning of surgery.  Please wear clean clothes to the  hospital/surgery center.  FAILURE TO FOLLOW THESE INSTRUCTIONS MAY RESULT IN THE CANCELLATION OF YOUR SURGERY  PATIENT SIGNATURE_________________________________  NURSE SIGNATURE__________________________________  ________________________________________________________________________

## 2019-08-01 NOTE — Progress Notes (Signed)
Orders need second sign . Pre-op appt is 10-23 @ 11am. TY

## 2019-08-02 ENCOUNTER — Encounter (HOSPITAL_COMMUNITY): Payer: Self-pay

## 2019-08-02 ENCOUNTER — Other Ambulatory Visit: Payer: Self-pay

## 2019-08-02 ENCOUNTER — Encounter (HOSPITAL_COMMUNITY)
Admission: RE | Admit: 2019-08-02 | Discharge: 2019-08-02 | Disposition: A | Discharge status: Home / Self Care | Payer: Commercial Managed Care - PPO | Source: Ambulatory Visit | Attending: Urology | Admitting: Urology

## 2019-08-02 DIAGNOSIS — Z01818 Encounter for other preprocedural examination: Secondary | ICD-10-CM | POA: Insufficient documentation

## 2019-08-02 DIAGNOSIS — N201 Calculus of ureter: Secondary | ICD-10-CM | POA: Diagnosis not present

## 2019-08-02 HISTORY — DX: Migraine, unspecified, not intractable, without status migrainosus: G43.909

## 2019-08-02 LAB — BASIC METABOLIC PANEL
Anion gap: 9 (ref 5–15)
BUN: 12 mg/dL (ref 6–20)
CO2: 24 mmol/L (ref 22–32)
Calcium: 9.2 mg/dL (ref 8.9–10.3)
Chloride: 106 mmol/L (ref 98–111)
Creatinine, Ser: 0.57 mg/dL (ref 0.44–1.00)
GFR calc Af Amer: >60 mL/min (ref >60)
GFR calc non Af Amer: >60 mL/min (ref >60)
Glucose, Bld: 133 mg/dL — ABNORMAL HIGH (ref 70–99)
Potassium: 3.6 mmol/L (ref 3.5–5.1)
Sodium: 139 mmol/L (ref 135–145)

## 2019-08-02 LAB — CBC
HCT: 40 % (ref 36.0–46.0)
Hemoglobin: 13 g/dL (ref 12.0–15.0)
MCH: 29.5 pg (ref 26.0–34.0)
MCHC: 32.5 g/dL (ref 30.0–36.0)
MCV: 90.7 fL (ref 80.0–100.0)
Platelets: 296 10*3/uL (ref 150–400)
RBC: 4.41 MIL/uL (ref 3.87–5.11)
RDW: 12.6 % (ref 11.5–15.5)
WBC: 6.6 10*3/uL (ref 4.0–10.5)
nRBC: 0 % (ref 0.0–0.2)

## 2019-08-02 NOTE — Progress Notes (Signed)
SPOKE W/  Rebecka     SCREENING SYMPTOMS OF COVID 19:   COUGH--NO  RUNNY NOSE--- NO  SORE THROAT---NO  NASAL CONGESTION----NO  SNEEZING----NO  SHORTNESS OF BREATH---NO  DIFFICULTY BREATHING---NO  TEMP >100.0 -----NO  UNEXPLAINED BODY ACHES------NO  CHILLS -------- NO  HEADACHES ---------NO  LOSS OF SMELL/ TASTE --------NO    HAVE YOU OR ANY FAMILY MEMBER TRAVELLED PAST 14 DAYS OUT OF THE  COUNTY---NO STATE----NO COUNTRY----NO  HAVE YOU OR ANY FAMILY MEMBER BEEN EXPOSED TO ANYONE WITH COVID 19? NO    

## 2019-08-02 NOTE — Progress Notes (Signed)
PCP - Dr. Elijah Birk last offce visit 06/26/2019 in epic Cardiologist - N/A  Chest x-ray - N/A EKG - 08/02/2019 in epic Stress Test -  N/A ECHO - N/A Cardiac Cath - N/A  Sleep Study - N/A CPAP - N/A  Fasting Blood Sugar - 110 Checks Blood Sugar checks infrequently  Blood Thinner Instructions: N/A Aspirin Instructions: N/A Last Dose: N/A  Anesthesia review:  N/A  Patient denies shortness of breath, fever, cough and chest pain at PAT appointment   Patient verbalized understanding of instructions that were given to them at the PAT appointment. Patient was also instructed that they will need to review over the PAT instructions again at home before surgery.

## 2019-08-03 LAB — NOVEL CORONAVIRUS, NAA: SARS-CoV-2, NAA: NOT DETECTED

## 2019-08-05 ENCOUNTER — Ambulatory Visit (HOSPITAL_COMMUNITY): Payer: Commercial Managed Care - PPO | Admitting: Emergency Medicine

## 2019-08-05 ENCOUNTER — Encounter (HOSPITAL_COMMUNITY): Payer: Self-pay | Admitting: *Deleted

## 2019-08-05 ENCOUNTER — Ambulatory Visit (HOSPITAL_COMMUNITY): Payer: Commercial Managed Care - PPO

## 2019-08-05 ENCOUNTER — Ambulatory Visit (HOSPITAL_COMMUNITY): Payer: Commercial Managed Care - PPO | Admitting: Certified Registered"

## 2019-08-05 ENCOUNTER — Encounter (HOSPITAL_COMMUNITY)
Admission: RE | Disposition: A | Discharge status: Home / Self Care | Payer: Self-pay | Source: Home / Self Care | Attending: Urology

## 2019-08-05 ENCOUNTER — Ambulatory Visit (HOSPITAL_COMMUNITY)
Admission: RE | Admit: 2019-08-05 | Discharge: 2019-08-05 | Disposition: A | Discharge status: Home / Self Care | Payer: Commercial Managed Care - PPO | Attending: Urology | Admitting: Urology

## 2019-08-05 DIAGNOSIS — E119 Type 2 diabetes mellitus without complications: Secondary | ICD-10-CM | POA: Diagnosis not present

## 2019-08-05 DIAGNOSIS — N132 Hydronephrosis with renal and ureteral calculous obstruction: Secondary | ICD-10-CM | POA: Diagnosis not present

## 2019-08-05 DIAGNOSIS — N201 Calculus of ureter: Secondary | ICD-10-CM | POA: Diagnosis present

## 2019-08-05 HISTORY — PX: HOLMIUM LASER APPLICATION: SHX5852

## 2019-08-05 HISTORY — PX: CYSTOSCOPY WITH RETROGRADE PYELOGRAM, URETEROSCOPY AND STENT PLACEMENT: SHX5789

## 2019-08-05 LAB — GLUCOSE, CAPILLARY
Glucose-Capillary: 99 mg/dL (ref 70–99)
Glucose-Capillary: 99 mg/dL (ref 70–99)

## 2019-08-05 SURGERY — CYSTOURETEROSCOPY, WITH RETROGRADE PYELOGRAM AND STENT INSERTION
Anesthesia: General | Site: Ureter | Laterality: Right

## 2019-08-05 MED ORDER — MIDAZOLAM HCL 2 MG/2ML IJ SOLN
INTRAMUSCULAR | Status: AC
  Filled 2019-08-05: qty 2

## 2019-08-05 MED ORDER — CEPHALEXIN 500 MG PO CAPS: 500.0000 mg | ORAL_CAPSULE | Freq: Two times a day (BID) | ORAL | 0 refills | Status: DC

## 2019-08-05 MED ORDER — CEFAZOLIN SODIUM-DEXTROSE 2-4 GM/100ML-% IV SOLN
2.0000 g | INTRAVENOUS | Status: AC
  Administered 2019-08-05: 2 g via INTRAVENOUS
  Filled 2019-08-05: qty 100

## 2019-08-05 MED ORDER — OXYCODONE HCL 5 MG PO TABS: 5.0000 mg | ORAL_TABLET | Freq: Once | ORAL | Status: DC | PRN

## 2019-08-05 MED ORDER — KETOROLAC TROMETHAMINE 30 MG/ML IJ SOLN: 30.0000 mg | Freq: Once | INTRAMUSCULAR | Status: DC | PRN

## 2019-08-05 MED ORDER — ACETAMINOPHEN 325 MG PO TABS: 325.0000 mg | ORAL_TABLET | ORAL | Status: DC | PRN

## 2019-08-05 MED ORDER — FENTANYL CITRATE (PF) 100 MCG/2ML IJ SOLN: 25.0000 ug | INTRAMUSCULAR | Status: DC | PRN

## 2019-08-05 MED ORDER — LACTATED RINGERS IV SOLN
INTRAVENOUS | Status: DC
  Administered 2019-08-05 (×2): via INTRAVENOUS

## 2019-08-05 MED ORDER — ONDANSETRON HCL 4 MG/2ML IJ SOLN
INTRAMUSCULAR | Status: DC | PRN
  Administered 2019-08-05: 4 mg via INTRAVENOUS

## 2019-08-05 MED ORDER — MIDAZOLAM HCL 5 MG/5ML IJ SOLN
INTRAMUSCULAR | Status: DC | PRN
  Administered 2019-08-05: 2 mg via INTRAVENOUS

## 2019-08-05 MED ORDER — FENTANYL CITRATE (PF) 100 MCG/2ML IJ SOLN
INTRAMUSCULAR | Status: AC
  Filled 2019-08-05: qty 2

## 2019-08-05 MED ORDER — MEPERIDINE HCL 50 MG/ML IJ SOLN: 6.2500 mg | INTRAMUSCULAR | Status: DC | PRN

## 2019-08-05 MED ORDER — OXYCODONE HCL 5 MG/5ML PO SOLN: 5.0000 mg | Freq: Once | ORAL | Status: DC | PRN

## 2019-08-05 MED ORDER — PROPOFOL 10 MG/ML IV BOLUS
INTRAVENOUS | Status: DC | PRN
  Administered 2019-08-05: 120 mg via INTRAVENOUS

## 2019-08-05 MED ORDER — PROMETHAZINE HCL 25 MG/ML IJ SOLN: 6.2500 mg | INTRAMUSCULAR | Status: DC | PRN

## 2019-08-05 MED ORDER — IOHEXOL 300 MG/ML  SOLN
INTRAMUSCULAR | Status: DC | PRN
  Administered 2019-08-05: 6 mL via URETHRAL

## 2019-08-05 MED ORDER — DEXAMETHASONE SODIUM PHOSPHATE 10 MG/ML IJ SOLN
INTRAMUSCULAR | Status: DC | PRN
  Administered 2019-08-05: 10 mg via INTRAVENOUS

## 2019-08-05 MED ORDER — ACETAMINOPHEN 160 MG/5ML PO SOLN: 325.0000 mg | ORAL | Status: DC | PRN

## 2019-08-05 MED ORDER — LIDOCAINE 2% (20 MG/ML) 5 ML SYRINGE
INTRAMUSCULAR | Status: DC | PRN
  Administered 2019-08-05: 100 mg via INTRAVENOUS

## 2019-08-05 MED ORDER — OXYBUTYNIN CHLORIDE 5 MG PO TABS: 5.0000 mg | ORAL_TABLET | Freq: Three times a day (TID) | ORAL | 1 refills | Status: DC | PRN

## 2019-08-05 MED ORDER — FENTANYL CITRATE (PF) 100 MCG/2ML IJ SOLN
INTRAMUSCULAR | Status: DC | PRN
  Administered 2019-08-05 (×2): 50 ug via INTRAVENOUS

## 2019-08-05 SURGICAL SUPPLY — 24 items
BAG URO CATCHER STRL LF (MISCELLANEOUS) ×2 IMPLANT
BASKET LASER NITINOL 1.9FR (BASKET) ×2 IMPLANT
BASKET ZERO TIP NITINOL 2.4FR (BASKET) IMPLANT
CATH INTERMIT  6FR 70CM (CATHETERS) ×2 IMPLANT
CLOTH BEACON ORANGE TIMEOUT ST (SAFETY) ×2 IMPLANT
COVER SURGICAL LIGHT HANDLE (MISCELLANEOUS) ×2 IMPLANT
COVER WAND RF STERILE (DRAPES) IMPLANT
EXTRACTOR STONE NITINOL NGAGE (UROLOGICAL SUPPLIES) ×2 IMPLANT
FIBER LASER FLEXIVA 365 (UROLOGICAL SUPPLIES) ×2 IMPLANT
FIBER LASER TRAC TIP (UROLOGICAL SUPPLIES) IMPLANT
GAUZE 4X4 16PLY RFD (DISPOSABLE) ×2 IMPLANT
GLOVE BIOGEL M 8.0 STRL (GLOVE) ×2 IMPLANT
GOWN STRL REUS W/TWL XL LVL3 (GOWN DISPOSABLE) ×2 IMPLANT
GUIDEWIRE ANG ZIPWIRE 038X150 (WIRE) IMPLANT
GUIDEWIRE STR DUAL SENSOR (WIRE) ×2 IMPLANT
IV NS 1000ML (IV SOLUTION) ×1
IV NS 1000ML BAXH (IV SOLUTION) ×1 IMPLANT
KIT TURNOVER KIT A (KITS) IMPLANT
MANIFOLD NEPTUNE II (INSTRUMENTS) ×2 IMPLANT
PACK CYSTO (CUSTOM PROCEDURE TRAY) ×2 IMPLANT
SHEATH URETERAL 12FRX28CM (UROLOGICAL SUPPLIES) ×2 IMPLANT
STENT URET 6FRX24 CONTOUR (STENTS) ×2 IMPLANT
TUBING CONNECTING 10 (TUBING) ×2 IMPLANT
TUBING UROLOGY SET (TUBING) ×2 IMPLANT

## 2019-08-05 NOTE — Anesthesia Postprocedure Evaluation (Signed)
Anesthesia Post Note  Patient: Emera Spath  Procedure(s) Performed: CYSTOSCOPY WITH RETROGRADE PYELOGRAM, URETEROSCOPY AND STENT PLACEMENTwith laser (Right Ureter) HOLMIUM LASER APPLICATION (Right Ureter)     Patient location during evaluation: PACU Anesthesia Type: General Level of consciousness: awake Pain management: pain level controlled Vital Signs Assessment: post-procedure vital signs reviewed and stable Respiratory status: spontaneous breathing Cardiovascular status: stable Postop Assessment: no apparent nausea or vomiting Anesthetic complications: no    Last Vitals:  Vitals:   08/05/19 1415 08/05/19 1430  BP: (!) 151/107 (!) 163/93  Pulse: 71 62  Resp: 18 15  Temp:  36.7 C  SpO2: 99% 98%    Last Pain:  Vitals:   08/05/19 1430  TempSrc:   PainSc: 0-No pain   Pain Goal:                   Huston Foley

## 2019-08-05 NOTE — Transfer of Care (Signed)
Immediate Anesthesia Transfer of Care Note  Patient: Tiffany Hicks  Procedure(s) Performed: CYSTOSCOPY WITH RETROGRADE PYELOGRAM, URETEROSCOPY AND STENT PLACEMENTwith laser (Right Ureter) HOLMIUM LASER APPLICATION (Right Ureter)  Patient Location: PACU  Anesthesia Type:General  Level of Consciousness: drowsy, patient cooperative and responds to stimulation  Airway & Oxygen Therapy: Patient Spontanous Breathing and Patient connected to face mask oxygen  Post-op Assessment: Report given to RN and Post -op Vital signs reviewed and stable  Post vital signs: Reviewed and stable  Last Vitals:  Vitals Value Taken Time  BP    Temp    Pulse 69 08/05/19 1410  Resp 9 08/05/19 1410  SpO2 100 % 08/05/19 1410  Vitals shown include unvalidated device data.  Last Pain:  Vitals:   08/05/19 1117  TempSrc: Oral         Complications: No apparent anesthesia complications

## 2019-08-05 NOTE — Discharge Instructions (Signed)

## 2019-08-05 NOTE — H&P (Signed)
H&P  Chief Complaint: Rt ureteral stone  History of Present Illness: Tiffany Hicks is a 48 y.o. year old female presenting for URS for mgmt of a persistent rt mid/distal ureteral stone w/ associated hydro.  Past Medical History:  Diagnosis Date  . Allergic rhinitis    Allergy testing approx 2012 and 2019.  . Diabetes mellitus ([place]) 05/2018   Hba1c 6.5%  . Food allergy    cucumber, mushroom, orange, sesame seed  . Gestational diabetes    Both pregnancies.  . Hyperlipemia, mixed 05/2018   LDL 189-->statin started.  . Left carpal tunnel syndrome   . Migraines   . Multiple thyroid nodules    FNA of one nodule: benign follic nodule/cyst on 12/16/23.  Needs f/u u/s 11/2018 for other nodules.  . Right ureteral stone 11/2018   MET->urol f/u 12/2018->no impr->cont MET + pain med, discussion of ureteroscopy vs ESWL was done and pt considering.    Past Surgical History:  Procedure Laterality Date  . BIOPSY THYROID Right 11/15/2017   benign.    . CESAREAN SECTION  08/03/2011   Procedure: CESAREAN SECTION;  Surgeon: Margarette Asal;  Location: [facility];  Service: Gynecology;  Laterality: N/A;    Home Medications:  No medications prior to admission.    Allergies:  Allergies  Allergen Reactions  . Cucumber Extract Rash  . Mushroom Extract Complex Rash  . Peanut-Containing Drug Products Rash  . Soy Allergy Rash    Family History  Problem Relation Age of Onset  . Hypertension Mother   . Diabetes Mother   . Lung cancer Mother        stage 4, non-smoker  . Hypertension Father   . Hyperlipidemia Father   . Diabetes Sister   . Hyperlipidemia Brother   . Diabetes Brother   . Diabetes Brother   . Hyperlipidemia Brother     Social History:  reports that she has never smoked. She has never used smokeless tobacco. She reports that she does not drink alcohol or use drugs.  ROS: A complete review of systems was performed.  All systems are negative except for pertinent findings as  noted.  Physical Exam:  Vital signs in last 24 hours:   General:  Alert and oriented, No acute distress HEENT: Normocephalic, atraumatic Neck: No JVD or lymphadenopathy Cardiovascular: Regular rate and rhythm Lungs: Clear bilaterally Abdomen: Soft, nontender, nondistended, no abdominal masses Back: No CVA tenderness Extremities: No edema Neurologic: Grossly intact  Laboratory Data:  No results found for this or any previous visit (from the past 24 hour(s)). Recent Results (from the past 240 hour(s))  Novel Coronavirus, NAA ([facility])     Status: None   Collection Time: 08/01/19 12:00 AM   Specimen: Nasopharyngeal(NP) swabs in vial transport medium   NASOPHARYNGE  TESTING  Result Value Ref Range Status   SARS-CoV-2, NAA Not Detected Not Detected Final    Comment: This nucleic acid amplification test was developed and its performance characteristics determined by [employer]. Nucleic acid amplification tests include PCR and TMA. This test has not been FDA cleared or approved. This test has been authorized by FDA under an Emergency Use Authorization (EUA). This test is only authorized for the duration of time the declaration that circumstances exist justifying the authorization of the emergency use of in vitro diagnostic tests for detection of SARS-CoV-2 virus and/or diagnosis of COVID-19 infection under section 564(b)(1) of the Act, 21 U.S.C. 053ZJQ-7(H) (1), unless the authorization is terminated or revoked sooner. When diagnostic  testing is negative, the possibility of a false negative result should be considered in the context of a patient's recent exposures and the presence of clinical signs and symptoms consistent with COVID-19. An individual without symptoms of COVID-19 and who is not shedding SARS-CoV-2 virus would  expect to have a negative (not detected) result in this assay.    Creatinine: Recent Labs    08/02/19 1221  CREATININE 0.57    Radiologic  Imaging: No results found.  Impression/Assessment:  Rt ureteral stone  Plan:  Cysto/rt rgp/rt URS/HLL/extraction/J2 stent  Lillette Boxer Semya Klinke 08/05/2019, 8:56 AM  Lillette Boxer. Majesta Leichter MD

## 2019-08-05 NOTE — Anesthesia Procedure Notes (Signed)
Procedure Name: LMA Insertion Date/Time: 08/05/2019 1:21 PM Performed by: Silas Sacramento, CRNA Pre-anesthesia Checklist: Patient identified, Emergency Drugs available, Suction available and Patient being monitored Patient Re-evaluated:Patient Re-evaluated prior to induction Oxygen Delivery Method: Circle system utilized Preoxygenation: Pre-oxygenation with 100% oxygen Induction Type: IV induction LMA: LMA inserted LMA Size: 4.0 Tube type: Oral Number of attempts: 1 Placement Confirmation: positive ETCO2 and breath sounds checked- equal and bilateral Tube secured with: Tape Dental Injury: Teeth and Oropharynx as per pre-operative assessment

## 2019-08-05 NOTE — Anesthesia Preprocedure Evaluation (Addendum)
Anesthesia Evaluation  Patient identified by MRN, date of birth, ID band Patient awake    Airway Mallampati: II       Dental no notable dental hx. (+) Teeth Intact   Pulmonary neg pulmonary ROS,    Pulmonary exam normal breath sounds clear to auscultation       Cardiovascular negative cardio ROS Normal cardiovascular exam Rhythm:Regular Rate:Normal     Neuro/Psych negative psych ROS   GI/Hepatic negative GI ROS, Neg liver ROS,   Endo/Other  diabetes  Renal/GU negative Renal ROS  negative genitourinary   Musculoskeletal negative musculoskeletal ROS (+)   Abdominal Normal abdominal exam  (+)   Peds  Hematology negative hematology ROS (+)   Anesthesia Other Findings   Reproductive/Obstetrics                            Anesthesia Physical Anesthesia Plan  ASA: II  Anesthesia Plan: General   Post-op Pain Management:    Induction: Intravenous  PONV Risk Score and Plan: 4 or greater and Ondansetron and Midazolam  Airway Management Planned: LMA  Additional Equipment: None  Intra-op Plan:   Post-operative Plan: Extubation in OR  Informed Consent: I have reviewed the patients History and Physical, chart, labs and discussed the procedure including the risks, benefits and alternatives for the proposed anesthesia with the patient or authorized representative who has indicated his/her understanding and acceptance.     Dental advisory given  Plan Discussed with: CRNA  Anesthesia Plan Comments:         Anesthesia Quick Evaluation

## 2019-08-05 NOTE — Op Note (Signed)
Preoperative diagnosis: Right mid ureteral stone with significant hydronephrosis  Postoperative diagnosis: Same  Principal procedure: Cystoscopy, right retrograde ureteropyelogram, fluoroscopic interpretation, right ureteroscopy, holmium laser lithotripsy and extraction of right ureteral calculus, placement of 6 French by 24 cm contour double-J stent without tether  Surgeon: Arisa Congleton  Anesthesia: General with LMA  Complications: None  Specimen: Stone fragments  Drains: Above-mentioned stent  Estimated blood loss: Less than 10 mL  Indications: 48 year old female recently seen earlier in the year with a moderate size right mid ureteral calculus.  The patient has not followed up as requested, but has been relatively asymptomatic.  However, recent follow-up in the office included a KUB as well as renal ultrasound.  This revealed persistent calcification in her right mid ureteral area as well as associated right hydroureteronephrosis on ultrasound.  It was recommended that she undergo management of the stone.  Because of the long-term indwelling stone, I felt more than likely there was significant ureteral stricture associated with this, and that perhaps lithotripsy would not adequately relieve the patient of her stone burden despite adequate fragmentation.  I did recommend direct ureteroscopy with laser, extraction of stone and double-J stent placement.  The patient has agreed with this.  I discussed risks and complications including but not limited to ureteral injury, inadequate stone clearance, need for secondary procedure, ureteral stricture, infection, anesthetic complications, among others.  She desires to proceed and understands these.  Description of procedure: The patient was properly identified and marked in the holding area.  She was taken to the operating room where general anesthetic was administered with the LMA.  She was placed in the dorsolithotomy position.  Genitalia and perineum  were prepped and draped.  Proper timeout was performed.  70 French panendoscope was advanced into her bladder which was inspected circumferentially and found to have normal urothelium and normally placed ureteral orifice ease.  The right ureteral orifice was cannulated with 6 [place] open-ended catheter and using Omnipaque retrograde ureteropyelogram was performed.  This revealed a normal distal ureter but in the ureter overlying the sacral wing, there was a filling defect with persistently dilated ureter proximal to this.  Additionally, there was pyelocaliectasis.  No other ureteral filling defects were noted.  Guidewire was advanced into the upper pole calyceal system through the open-ended catheter, the catheter and scope were then removed.  The ureter was sequentially dilated distally with first the inner core and then the entire 12/14 ureteral access catheter.  The guidewire was left indwelling, and the semirigid ureteroscope was advanced under direct vision.  The stone was impacted in the posterior medial ureteral wall.  It was gently teased off of the wall, and then fragmented using 365 m fiber using holmium laser set at 30 Hz and 0.5 J.  Several smaller fragments were easily produced, which were then grasped with the engage basket and extracted into the bladder where they were dropped.  Following basketing all significant stone material, inspection of the ureter from the ureterovesical junction to the ureteropelvic junction revealed no further stone matter.  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  At this point, I felt that leaving a stent for length of time was worthwhile.  A 24 cm x 6 [place] contour double-J stent with a tether removed was then deployed in the ureter over top of the guidewire, with excellent proximal and distal curl seen in the renal pelvis and the bladder, sequentially, using fluoroscopic and cystoscopic guidance.  Following this, the bladder  was drained.  The scope was removed.  The procedure was then terminated.  The patient was then awakened.  She was taken to the PACU in stable condition, having tolerated the procedure well.

## 2019-08-06 ENCOUNTER — Encounter (HOSPITAL_COMMUNITY): Payer: Self-pay | Admitting: Urology

## 2019-08-07 ENCOUNTER — Other Ambulatory Visit: Payer: Self-pay | Admitting: Obstetrics and Gynecology

## 2019-08-07 DIAGNOSIS — E041 Nontoxic single thyroid nodule: Secondary | ICD-10-CM

## 2019-08-13 ENCOUNTER — Ambulatory Visit
Admission: RE | Admit: 2019-08-13 | Discharge: 2019-08-13 | Disposition: A | Discharge status: Home / Self Care | Payer: Commercial Managed Care - PPO | Source: Ambulatory Visit | Attending: Obstetrics and Gynecology | Admitting: Obstetrics and Gynecology

## 2019-08-13 DIAGNOSIS — E041 Nontoxic single thyroid nodule: Secondary | ICD-10-CM

## 2019-08-20 ENCOUNTER — Encounter: Payer: Self-pay | Admitting: Family Medicine

## 2019-09-16 ENCOUNTER — Other Ambulatory Visit: Payer: Self-pay | Admitting: Family Medicine

## 2019-09-16 NOTE — Telephone Encounter (Signed)
RF request for Atorvastatin LOV:06/26/19 Next ov: 09/25/19 Last written: n/a  Medication has never been prescribed by you, please advise if okay for fill. Medication pending

## 2019-09-24 ENCOUNTER — Other Ambulatory Visit: Payer: Self-pay

## 2019-09-25 ENCOUNTER — Ambulatory Visit: Payer: Commercial Managed Care - PPO | Admitting: Family Medicine

## 2019-09-25 ENCOUNTER — Encounter: Payer: Self-pay | Admitting: Family Medicine

## 2019-09-25 ENCOUNTER — Other Ambulatory Visit: Payer: Self-pay

## 2019-09-25 VITALS — BP 120/84 | HR 80 | Temp 98.3°F | Resp 16 | Ht 60.0 in | Wt 142.8 lb

## 2019-09-25 DIAGNOSIS — Z0000 Encounter for general adult medical examination without abnormal findings: Secondary | ICD-10-CM

## 2019-09-25 DIAGNOSIS — E7800 Pure hypercholesterolemia, unspecified: Secondary | ICD-10-CM | POA: Diagnosis not present

## 2019-09-25 DIAGNOSIS — Z23 Encounter for immunization: Secondary | ICD-10-CM | POA: Diagnosis not present

## 2019-09-25 DIAGNOSIS — E119 Type 2 diabetes mellitus without complications: Secondary | ICD-10-CM

## 2019-09-25 DIAGNOSIS — N201 Calculus of ureter: Secondary | ICD-10-CM | POA: Diagnosis not present

## 2019-09-25 LAB — COMPREHENSIVE METABOLIC PANEL WITH GFR
ALT: 21 U/L (ref 0–35)
AST: 14 U/L (ref 0–37)
Albumin: 4 g/dL (ref 3.5–5.2)
Alkaline Phosphatase: 81 U/L (ref 39–117)
BUN: 11 mg/dL (ref 6–23)
CO2: 29 meq/L (ref 19–32)
Calcium: 9.3 mg/dL (ref 8.4–10.5)
Chloride: 104 meq/L (ref 96–112)
Creatinine, Ser: 0.55 mg/dL (ref 0.40–1.20)
GFR: 116.75 mL/min
Glucose, Bld: 109 mg/dL — ABNORMAL HIGH (ref 70–99)
Potassium: 4 meq/L (ref 3.5–5.1)
Sodium: 138 meq/L (ref 135–145)
Total Bilirubin: 0.7 mg/dL (ref 0.2–1.2)
Total Protein: 7 g/dL (ref 6.0–8.3)

## 2019-09-25 LAB — MICROALBUMIN / CREATININE URINE RATIO
Creatinine,U: 146.2 mg/dL
Microalb Creat Ratio: 2.6 mg/g (ref 0.0–30.0)
Microalb, Ur: 3.7 mg/dL — ABNORMAL HIGH (ref 0.0–1.9)

## 2019-09-25 LAB — LIPID PANEL
Cholesterol: 138 mg/dL (ref 0–200)
HDL: 43 mg/dL (ref >39.00)
LDL Cholesterol: 75 mg/dL (ref 0–99)
NonHDL: 95.49
Total CHOL/HDL Ratio: 3
Triglycerides: 101 mg/dL (ref 0.0–149.0)
VLDL: 20.2 mg/dL (ref 0.0–40.0)

## 2019-09-25 LAB — HEMOGLOBIN A1C: Hgb A1c MFr Bld: 6.6 % — ABNORMAL HIGH (ref 4.6–6.5)

## 2019-09-25 NOTE — Addendum Note (Signed)
Addended by: Deveron Furlong D on: 09/25/2019 08:42 AM   Modules accepted: Orders

## 2019-09-25 NOTE — Progress Notes (Signed)
OFFICE VISIT  09/25/2019   CC:  Chief Complaint  Patient presents with  . Follow-up    RCI, pt is fasting    HPI:    Patient is a 48 y.o.  female who presents for 3 mo f/u HLD and DM 2.  She is feeling great.  NO acute complaints.  Since I last saw her she had right ureteroscopy, holmium laser lithotripsy and extraction of right ureteral calculus, placement of 6 French by 24 cm contour double-J stent without tether (08/05/19).  Feeling well from this.  DM: diet controlled. Last 2 glucose checks 100 or so. Has not had initial screening eye exam.  HLD; last visit we got her restarted on atorva 80, 1/2 tab qd, with instructions to increase to whole tab qd if tolerating it. She is on 1/2 of 98m qd tab, doing better with lower carbs and fats.  No sodas.  ROS: no CP, no SOB, no wheezing, no cough, no dizziness, no HAs, no rashes, no melena/hematochezia.  No polyuria or polydipsia.  No myalgias or arthralgias.  Past Medical History:  Diagnosis Date  . Allergic rhinitis    Allergy testing approx 2012 and 2019.  . [place] 05[place]   Hba1c 6.5%  . Food allergy    cucumber, mushroom, orange, sesame seed  . Gestational diabetes    Both pregnancies.  . Hyperlipemia, mixed 05/2018   LDL 189-->statin started.  . Left carpal tunnel syndrome   . Migraines   . Multiple thyroid nodules    FNA of one nodule: benign follic nodule/cyst on 26/2/03  Needs f/u u/s 11/2018 for other nodules.  . Right ureteral stone 11/2018   MET->urol f/u 12/2018->no impr->cont MET + pain med, discussion of ureteroscopy vs ESWL was done and pt considering.  . Ureterolithiasis    ureteroscopy with holmium laser 08/05/19    Past Surgical History:  Procedure Laterality Date  . BIOPSY THYROID Right 11/15/2017   benign.    . CESAREAN SECTION  08/03/2011   Procedure: CESAREAN SECTION;  Surgeon: RMargarette Asal  [facility];  Service: Gynecology;  Laterality: N/A;  . CYSTOSCOPY WITH  RETROGRADE PYELOGRAM, URETEROSCOPY AND STENT PLACEMENT Right 08/05/2019   Procedure: CYSTOSCOPY WITH RETROGRADE PYELOGRAM, URETEROSCOPY AND STENT PLACEMENTwith laser;  Surgeon: DFranchot Gallo MD;  Location: [facility];  Service: Urology;  Laterality: Right;  1 HR  . HOLMIUM LASER APPLICATION Right 155/[id number]/4163  Procedure: HOLMIUM LASER APPLICATION;  Surgeon: DFranchot Gallo MD;  Location: [facility];  Service: Urology;  Laterality: Right;    Outpatient Medications Prior to Visit  Medication Sig Dispense Refill  . atorvastatin (LIPITOR) 80 MG tablet TAKE ONE TABLET BY MOUTH DAILY 90 tablet 3  . cetirizine (ZYRTEC ALLERGY) 10 MG tablet Take 10 mg by mouth daily.    .Marland Kitchenoxybutynin (DITROPAN) 5 MG tablet Take 1 tablet (5 mg total) by mouth every 8 (eight) hours as needed for bladder spasms. (Patient not taking: Reported on 09/25/2019) 30 tablet 1  . gabapentin (NEURONTIN) 300 MG capsule Take 300 mg by mouth at bedtime.    . meloxicam (MOBIC) 7.5 MG tablet Take 7.5 mg by mouth daily.     No facility-administered medications prior to visit.    Allergies  Allergen Reactions  . Cucumber Extract Rash  . Mushroom Extract Complex Rash  . Peanut-Containing Drug Products Rash  . Soy Allergy Rash    ROS As per HPI  PE: Blood pressure 120/84, pulse 80, temperature 98.3 F (36.8 C),  temperature source Temporal, resp. rate 16, height 5' (1.524 m), weight 142 lb 12.8 oz (64.8 kg), SpO2 97 %. Body mass index is 27.89 kg/m.  Gen: Alert, well appearing.  Patient is oriented to person, place, time, and situation. AFFECT: pleasant, lucid thought and speech. No further exam today.  LABS:  Lab Results  Component Value Date   TSH 1.10 07/01/2019   Lab Results  Component Value Date   WBC 6.6 08/02/2019   HGB 13.0 08/02/2019   HCT 40.0 08/02/2019   MCV 90.7 08/02/2019   PLT 296 08/02/2019   Lab Results  Component Value Date   CREATININE 0.57 08/02/2019   BUN 12 08/02/2019   NA 139 08/02/2019    K 3.6 08/02/2019   CL 106 08/02/2019   CO2 24 08/02/2019   Lab Results  Component Value Date   ALT 18 07/01/2019   AST 15 07/01/2019   ALKPHOS 50 07/01/2019   BILITOT 0.4 07/01/2019   Lab Results  Component Value Date   CHOL 241 (H) 07/01/2019   Lab Results  Component Value Date   HDL 42.80 07/01/2019   Lab Results  Component Value Date   LDLCALC 180 (H) 07/01/2019   Lab Results  Component Value Date   TRIG 91.0 07/01/2019   Lab Results  Component Value Date   CHOLHDL 6 07/01/2019   Lab Results  Component Value Date   HGBA1C 7.0 (H) 07/01/2019    IMPRESSION AND PLAN:  No problem-specific Assessment & Plan notes found for this encounter.  1) DM: good control as per occ home fasting measurements.  Diet only at this time. Needs her initial DM eye exam. HbA1c, urine microalb/cr, BMET today.  2) HLD: restarted atorva (40 mg qd) about 3 mo ago->tolerating-->FLP and hepatic panel today.  3) Right ureteral stone: s/p ureteroscopy, holmium laser, extraction. Doing great.  Has urol f/u.  4) Preventative health care: shingrix #2 due-->given today.  An After Visit Summary was printed and given to the patient.  FOLLOW UP: Return in about 4 months (around 01/24/2020) for routine chronic illness f/u.  Signed:  [name], MD           09/25/2019

## 2019-09-26 ENCOUNTER — Encounter: Payer: Self-pay | Admitting: Family Medicine

## 2019-10-02 ENCOUNTER — Encounter: Payer: Self-pay | Admitting: Family Medicine

## 2019-10-02 ENCOUNTER — Other Ambulatory Visit: Payer: Self-pay

## 2019-10-02 ENCOUNTER — Ambulatory Visit: Payer: Commercial Managed Care - PPO | Admitting: Family Medicine

## 2019-10-02 VITALS — BP 123/86 | HR 70 | Temp 98.1°F | Resp 16 | Ht 60.0 in | Wt 139.4 lb

## 2019-10-02 DIAGNOSIS — L720 Epidermal cyst: Secondary | ICD-10-CM | POA: Diagnosis not present

## 2019-10-02 NOTE — Progress Notes (Signed)
OFFICE VISIT  10/02/2019   CC:  Chief Complaint  Patient presents with  . Bump on mid back near spine    starting hurting 1 week ago, laying supine, hurts to touch   HPI:    Patient is a 48 y.o. female who presents for "bump on back". Noted it about 4 d/a.  Not red or painful.  A little uncomfortable when she lies back on it. No drainage, no overlying skin changes.  Past Medical History:  Diagnosis Date  . Allergic rhinitis    Allergy testing approx 2012 and 2019.  . Diabetes mellitus ([place]) 05/2018   Hba1c 6.5%  . Food allergy    cucumber, mushroom, orange, sesame seed  . Gestational diabetes    Both pregnancies.  . Hyperlipemia, mixed 05/2018   Great response to statin  . Left carpal tunnel syndrome   . Migraines   . Multiple thyroid nodules    FNA of one nodule: benign follic nodule/cyst on 10/15/08.  Needs f/u u/s 11/2018 for other nodules.  . Right ureteral stone 11/2018   MET->urol f/u 12/2018->no impr->cont MET + pain med, discussion of ureteroscopy vs ESWL was done and pt considering.  . Ureterolithiasis    ureteroscopy with holmium laser 08/05/19    Past Surgical History:  Procedure Laterality Date  . BIOPSY THYROID Right 11/15/2017   benign.    . CESAREAN SECTION  08/03/2011   Procedure: CESAREAN SECTION;  Surgeon: Margarette Asal;  Location: [facility];  Service: Gynecology;  Laterality: N/A;  . CYSTOSCOPY WITH RETROGRADE PYELOGRAM, URETEROSCOPY AND STENT PLACEMENT Right 08/05/2019   Procedure: CYSTOSCOPY WITH RETROGRADE PYELOGRAM, URETEROSCOPY AND STENT PLACEMENTwith laser;  Surgeon: Franchot Gallo, MD;  Location: [facility];  Service: Urology;  Laterality: Right;  1 HR  . HOLMIUM LASER APPLICATION Right 96/01/5408   Procedure: HOLMIUM LASER APPLICATION;  Surgeon: Franchot Gallo, MD;  Location: [facility];  Service: Urology;  Laterality: Right;    Outpatient Medications Prior to Visit  Medication Sig Dispense Refill  . atorvastatin (LIPITOR) 80 MG tablet TAKE  ONE TABLET BY MOUTH DAILY 90 tablet 3  . cetirizine (ZYRTEC ALLERGY) 10 MG tablet Take 10 mg by mouth daily.    Marland Kitchen oxybutynin (DITROPAN) 5 MG tablet Take 1 tablet (5 mg total) by mouth every 8 (eight) hours as needed for bladder spasms. (Patient not taking: Reported on 09/25/2019) 30 tablet 1   No facility-administered medications prior to visit.    Allergies  Allergen Reactions  . Cucumber Extract Rash  . Mushroom Extract Complex Rash  . Peanut-Containing Drug Products Rash  . Soy Allergy Rash    ROS As per HPI  PE: Blood pressure 123/86, pulse 70, temperature 98.1 F (36.7 C), temperature source Temporal, resp. rate 16, height 5' (1.524 m), weight 139 lb 6.4 oz (63.2 kg), SpO2 97 %. Exam chaperoned by Caroll Rancher, LPN. Mid back, to the left of spine, approx 2-3 cm oval soft/rubbery subQ nodule. No tenderness.  No erythema or warmth.  No skin opening/drainage.  LABS:  Lab Results  Component Value Date   HGBA1C 6.6 (H) 09/25/2019     Chemistry      Component Value Date/Time   NA 138 09/25/2019 0830   NA 139 03/27/2017 0000   K 4.0 09/25/2019 0830   CL 104 09/25/2019 0830   CO2 29 09/25/2019 0830   BUN 11 09/25/2019 0830   BUN 11 03/27/2017 0000   CREATININE 0.55 09/25/2019 0830   GLU 102 05/04/2018 0000  GLU 102 05/04/2018 0000      Component Value Date/Time   CALCIUM 9.3 09/25/2019 0830   ALKPHOS 81 09/25/2019 0830   AST 14 09/25/2019 0830   ALT 21 09/25/2019 0830   BILITOT 0.7 09/25/2019 0830      IMPRESSION AND PLAN:  1) Epidermal inclusion cyst: no infection.  Dx discussed with pt.  She is reassured and relieved that it does not appear to be cancer. Discussed conservative mgmt with warm compresses/heating pain 20 min every day minimum, but 2-3 times per day if possible.  Hopefully this will encourage drainage. Also told her it could spontaneously resolve/regress. This does NOT require I&D at this time.  No meds needed for this. Signs/symptoms to call  or return for were reviewed and pt expressed understanding.  An After Visit Summary was printed and given to the patient.  FOLLOW UP: Return if symptoms worsen or fail to improve.  Signed:  [name], MD           10/02/2019

## 2019-11-06 ENCOUNTER — Encounter: Payer: Self-pay | Admitting: Family Medicine

## 2020-01-21 ENCOUNTER — Encounter: Payer: Self-pay | Admitting: Family Medicine

## 2020-01-23 ENCOUNTER — Ambulatory Visit: Payer: Commercial Managed Care - PPO | Admitting: Family Medicine

## 2020-04-22 ENCOUNTER — Encounter: Payer: Self-pay | Admitting: Gastroenterology

## 2020-05-01 IMAGING — CT CT ABD-PELV W/ CM
2 of 5 series · 16 of 46 positions shown, 18 images · IV contrast (omnipaque)
Comparison: None.

CLINICAL DATA: Right-sided abdominal pain since this morning with
nausea and vomiting.

EXAM:
CT ABDOMEN AND PELVIS WITH CONTRAST
TECHNIQUE: Multidetector CT imaging of the abdomen and pelvis was performed
using the standard protocol following bolus administration of
intravenous contrast.
CONTRAST:  100mL OMNIPAQUE IOHEXOL 300 MG/ML  SOLN

[Series 3: abd/ pelvis 5.0 i30f 2 · axial · 0.61mm/px · z∈[+717,+1137]mm · 13 of 94 slices shown, 15 images]
[im 5/94  soft-tissue]
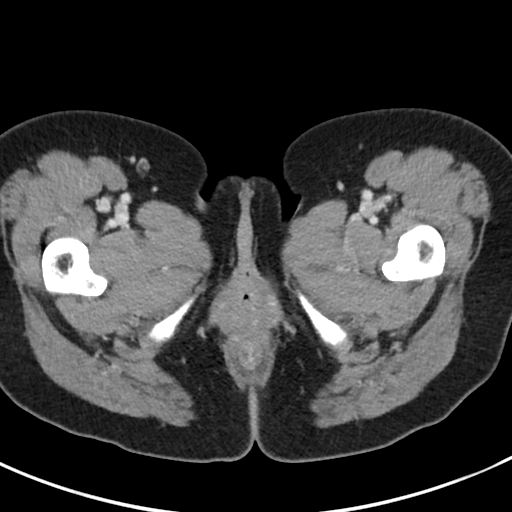
[im 5/94  bone]
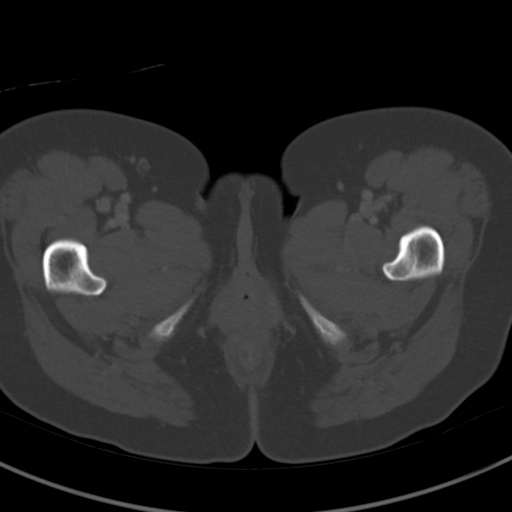
[im 15/94  soft-tissue]
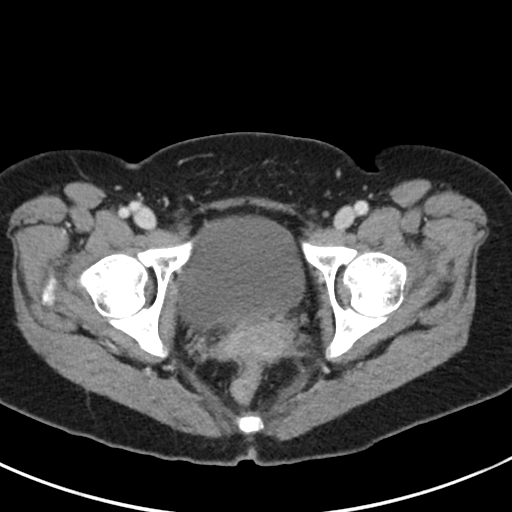
[im 20/94  soft-tissue]
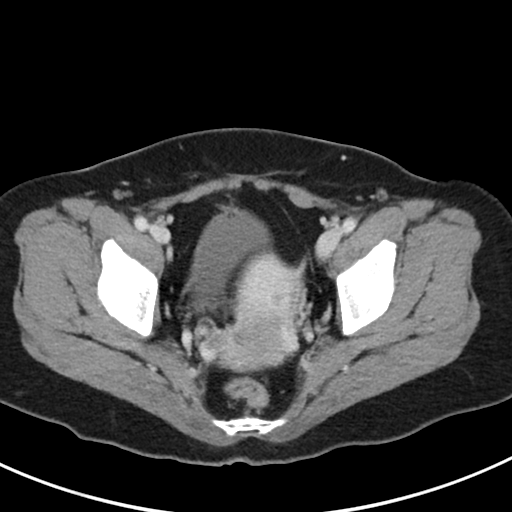
[im 25/94  soft-tissue]
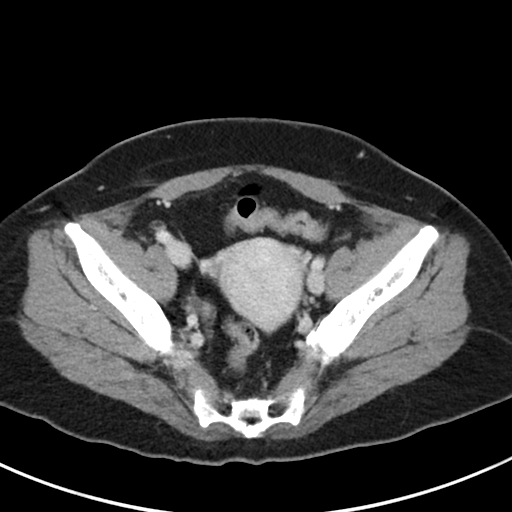
[im 35/94  soft-tissue]
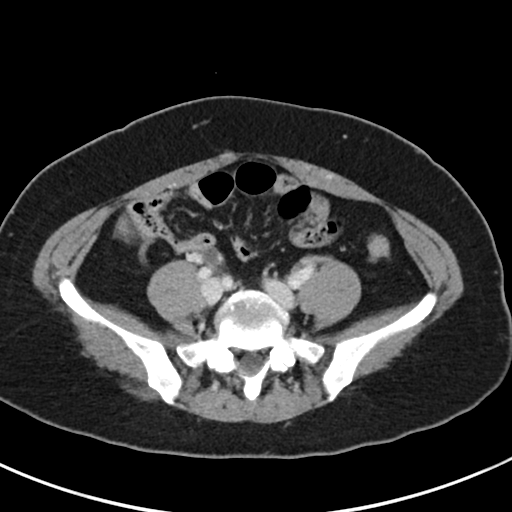
[im 40/94  soft-tissue]
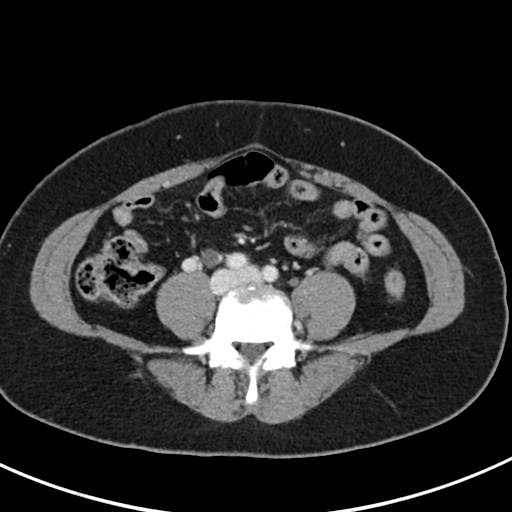
[im 49/94  soft-tissue]
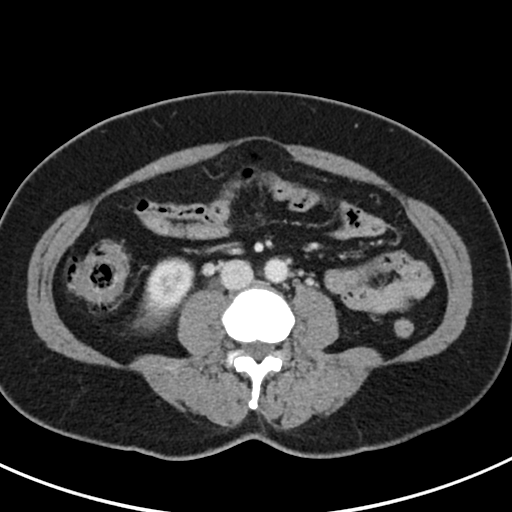
[im 54/94  soft-tissue]
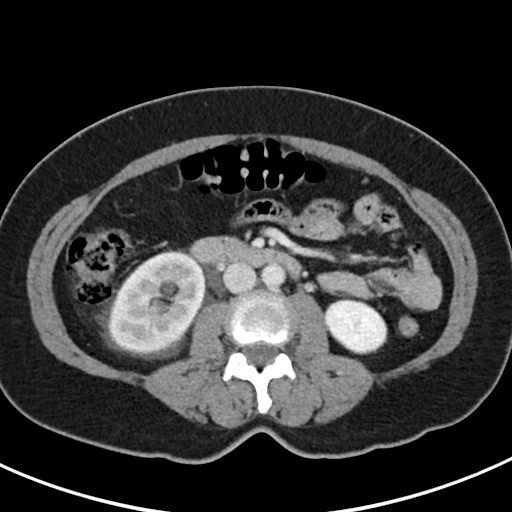
[im 59/94  soft-tissue]
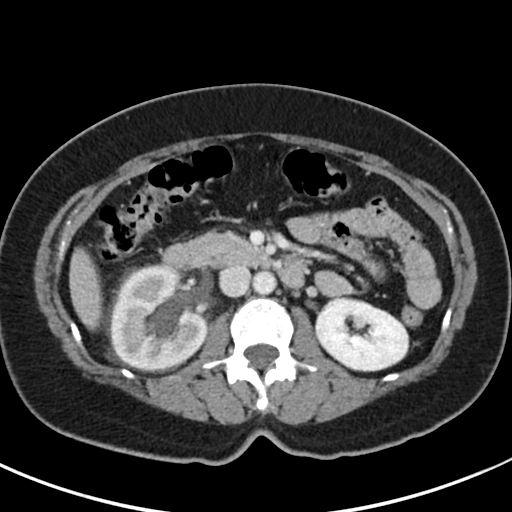
[im 59/94  bone]
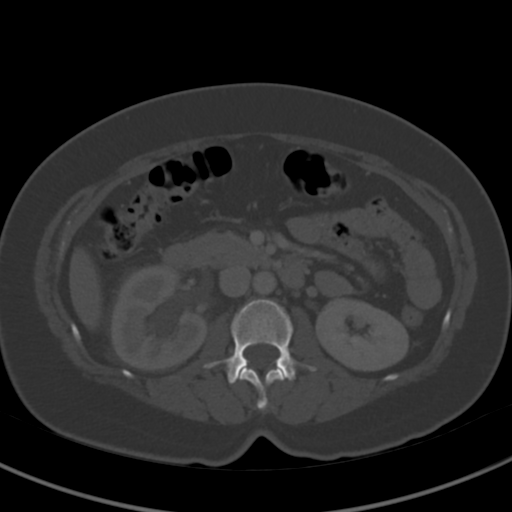
[im 69/94  soft-tissue]
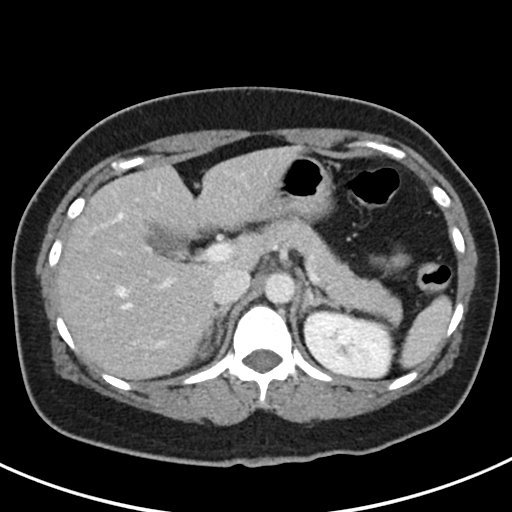
[im 74/94  soft-tissue]
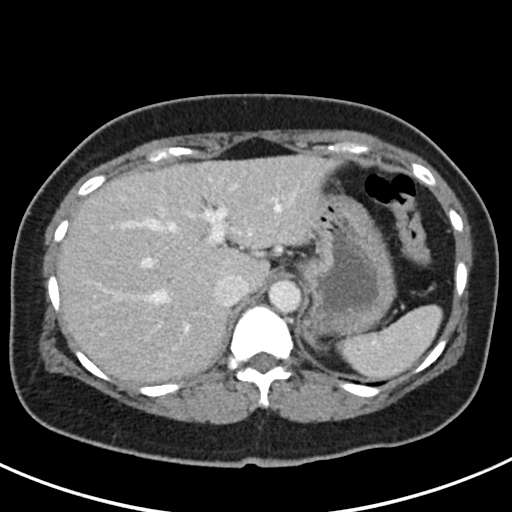
[im 79/94  soft-tissue]
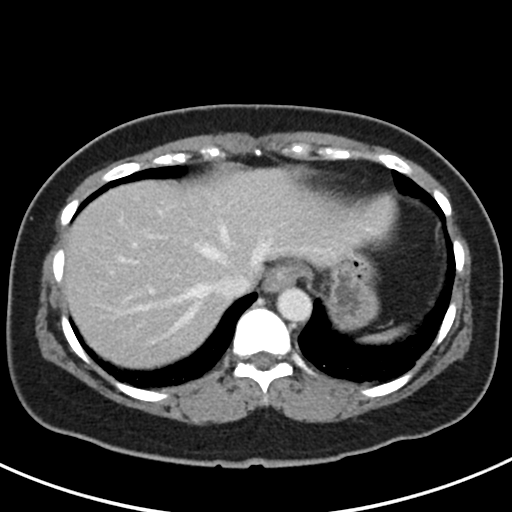
[im 89/94  soft-tissue]
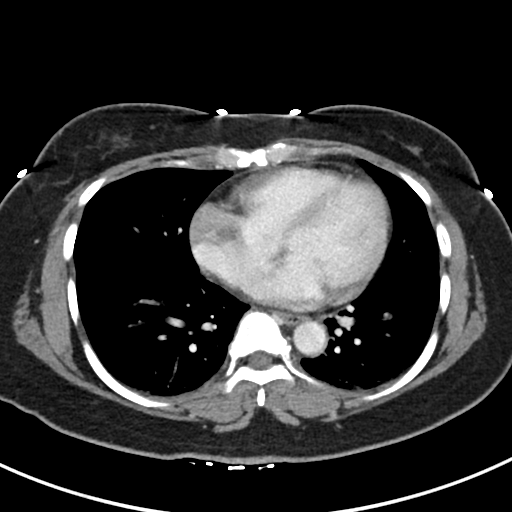

[Series 6: coronal soft tissue · coronal · 0.74mm/px · 3 of 95 slices shown]
[im 32/95  soft-tissue]
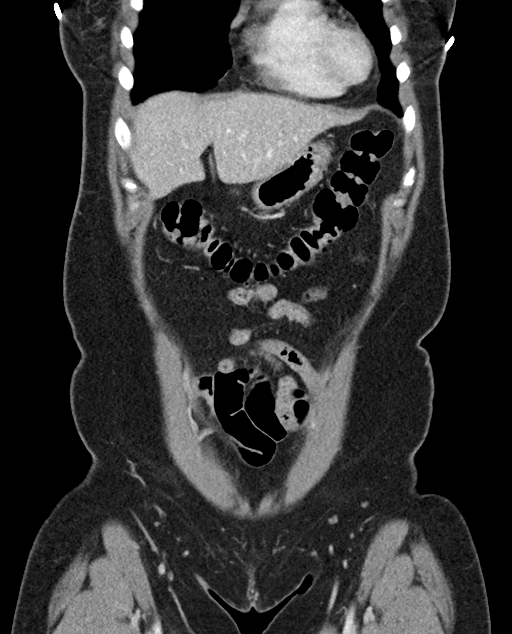
[im 42/95  soft-tissue]
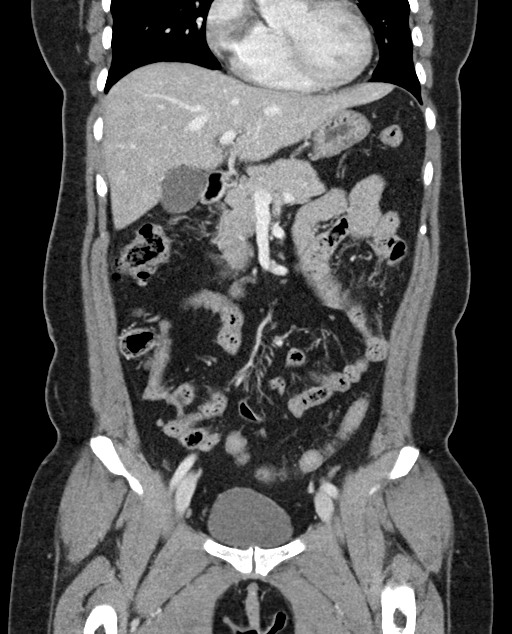
[im 53/95  soft-tissue]
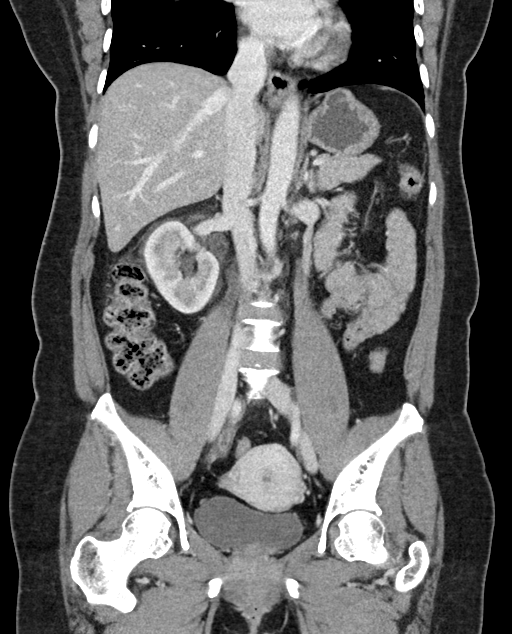

[16 of 46 positions shown; findings below may reference images not displayed]

FINDINGS: Lower chest: Lung bases are normal.

Hepatobiliary: Gallbladder, liver and biliary tree are normal.

Pancreas: Normal.

Spleen: Normal.

Adrenals/Urinary Tract: Adrenal glands are normal. Kidneys are
normal in size. Subcentimeter cortical hypodensity over the mid pole
left kidney too small to characterize but likely a cyst. There is
mild right-sided hydronephrosis with mild right perinephric
fluid/inflammation. Mild dilatation of the right ureter as there is
a 5 mm stone over the distal right ureter approximately 4-5 cm above
the UVJ causing this low to moderate grade obstruction. Left ureter
and bladder are normal.

Stomach/Bowel: Stomach and small bowel are normal. Appendix is
normal. Minimal diverticulosis of the colon which is otherwise
within normal.

Vascular/Lymphatic: Normal.

Reproductive: Normal.

Other: None.

Musculoskeletal: Unremarkable.
IMPRESSION: 5 mm stone over the distal right ureter approximately 4-5 cm above
the UVJ causing low to moderate grade obstruction.

Subcentimeter left renal cortical hypodensity too small to
characterize but likely a cyst.

Minimal colonic diverticulosis.

## 2020-06-08 ENCOUNTER — Telehealth: Payer: Self-pay

## 2020-06-08 NOTE — Telephone Encounter (Signed)
Left message for patient to call back to the office - patient no showed her pre visit appt at 8:00 am today;

## 2020-06-08 NOTE — Telephone Encounter (Signed)
Patient returned call to the office and cancelled appts for pre visit and procedure;

## 2020-06-16 ENCOUNTER — Encounter: Payer: Self-pay | Admitting: Family Medicine

## 2020-06-17 ENCOUNTER — Other Ambulatory Visit: Payer: Self-pay

## 2020-06-17 DIAGNOSIS — Z1211 Encounter for screening for malignant neoplasm of colon: Secondary | ICD-10-CM

## 2020-06-22 ENCOUNTER — Encounter: Payer: Commercial Managed Care - PPO | Admitting: Gastroenterology

## 2020-06-23 ENCOUNTER — Ambulatory Visit (AMBULATORY_SURGERY_CENTER): Payer: Self-pay

## 2020-06-23 ENCOUNTER — Other Ambulatory Visit: Payer: Self-pay

## 2020-06-23 VITALS — Ht 60.0 in | Wt 141.0 lb

## 2020-06-23 DIAGNOSIS — Z1211 Encounter for screening for malignant neoplasm of colon: Secondary | ICD-10-CM

## 2020-06-23 MED ORDER — SUTAB 1479-225-188 MG PO TABS: 1.0000 | ORAL_TABLET | ORAL | 0 refills | Status: DC

## 2020-06-23 NOTE — Progress Notes (Signed)
No egg or soy allergy known to patient  No issues with past sedation with any surgeries or procedures No intubation problems in the past  No FH of Malignant Hyperthermia No diet pills per patient No home 02 use per patient  No blood thinners per patient  Pt denies issues with constipation  No A fib or A flutter  EMMI video via MyChart  COVID 19 guidelines implemented in [facility] today with Pt and RN  Coupon given to pt in [facility] today , Code to Pharmacy  COVID vaccines completed on 12/2019 per pt;  Due to the COVID-19 pandemic we are asking patients to follow these guidelines. Please only bring one care partner. Please be aware that your care partner may wait in the car in the parking lot or if they feel like they will be too hot to wait in the car, they may wait in the lobby on the 4th floor. All care partners are required to wear a mask the entire time (we do not have any that we can provide them), they need to practice social distancing, and we will do a Covid check for all patient's and care partners when you arrive. Also we will check their temperature and your temperature. If the care partner waits in their car they need to stay in the parking lot the entire time and we will call them on their cell phone when the patient is ready for discharge so they can bring the car to the front of the building. Also all patient's will need to wear a mask into building.  

## 2020-06-24 ENCOUNTER — Encounter: Payer: Commercial Managed Care - PPO | Admitting: Gastroenterology

## 2020-06-26 ENCOUNTER — Encounter: Payer: Self-pay | Admitting: Family Medicine

## 2020-06-26 ENCOUNTER — Ambulatory Visit (INDEPENDENT_AMBULATORY_CARE_PROVIDER_SITE_OTHER): Payer: Commercial Managed Care - PPO | Admitting: Family Medicine

## 2020-06-26 ENCOUNTER — Other Ambulatory Visit: Payer: Self-pay

## 2020-06-26 VITALS — BP 130/87 | HR 72 | Temp 97.5°F | Resp 16 | Ht 60.43 in | Wt 143.0 lb

## 2020-06-26 DIAGNOSIS — E119 Type 2 diabetes mellitus without complications: Secondary | ICD-10-CM

## 2020-06-26 DIAGNOSIS — Z0000 Encounter for general adult medical examination without abnormal findings: Secondary | ICD-10-CM

## 2020-06-26 DIAGNOSIS — Z23 Encounter for immunization: Secondary | ICD-10-CM | POA: Diagnosis not present

## 2020-06-26 DIAGNOSIS — Z1211 Encounter for screening for malignant neoplasm of colon: Secondary | ICD-10-CM | POA: Diagnosis not present

## 2020-06-26 DIAGNOSIS — E7800 Pure hypercholesterolemia, unspecified: Secondary | ICD-10-CM | POA: Diagnosis not present

## 2020-06-26 LAB — COMPREHENSIVE METABOLIC PANEL
ALT: 19 U/L (ref 0–35)
AST: 15 U/L (ref 0–37)
Albumin: 3.9 g/dL (ref 3.5–5.2)
Alkaline Phosphatase: 60 U/L (ref 39–117)
BUN: 15 mg/dL (ref 6–23)
CO2: 30 mEq/L (ref 19–32)
Calcium: 9 mg/dL (ref 8.4–10.5)
Chloride: 103 mEq/L (ref 96–112)
Creatinine, Ser: 0.58 mg/dL (ref 0.40–1.20)
GFR: 109.48 mL/min (ref >60.00)
Glucose, Bld: 100 mg/dL — ABNORMAL HIGH (ref 70–99)
Potassium: 4.2 mEq/L (ref 3.5–5.1)
Sodium: 139 mEq/L (ref 135–145)
Total Bilirubin: 0.5 mg/dL (ref 0.2–1.2)
Total Protein: 6.9 g/dL (ref 6.0–8.3)

## 2020-06-26 LAB — CBC WITH DIFFERENTIAL/PLATELET
Basophils Absolute: 0 10*3/uL (ref 0.0–0.1)
Basophils Relative: 0.6 % (ref 0.0–3.0)
Eosinophils Absolute: 0.1 10*3/uL (ref 0.0–0.7)
Eosinophils Relative: 1.8 % (ref 0.0–5.0)
HCT: 38.5 % (ref 36.0–46.0)
Hemoglobin: 12.9 g/dL (ref 12.0–15.0)
Lymphocytes Relative: 31.8 % (ref 12.0–46.0)
Lymphs Abs: 2.2 10*3/uL (ref 0.7–4.0)
MCHC: 33.5 g/dL (ref 30.0–36.0)
MCV: 90.5 fl (ref 78.0–100.0)
Monocytes Absolute: 0.6 10*3/uL (ref 0.1–1.0)
Monocytes Relative: 8.1 % (ref 3.0–12.0)
Neutro Abs: 4 10*3/uL (ref 1.4–7.7)
Neutrophils Relative %: 57.7 % (ref 43.0–77.0)
Platelets: 308 10*3/uL (ref 150.0–400.0)
RBC: 4.26 Mil/uL (ref 3.87–5.11)
RDW: 13.7 % (ref 11.5–15.5)
WBC: 6.9 10*3/uL (ref 4.0–10.5)

## 2020-06-26 LAB — LIPID PANEL
Cholesterol: 179 mg/dL (ref 0–200)
HDL: 47.6 mg/dL (ref >39.00)
LDL Cholesterol: 109 mg/dL — ABNORMAL HIGH (ref 0–99)
NonHDL: 130.92
Total CHOL/HDL Ratio: 4
Triglycerides: 112 mg/dL (ref 0.0–149.0)
VLDL: 22.4 mg/dL (ref 0.0–40.0)

## 2020-06-26 LAB — HEMOGLOBIN A1C: Hgb A1c MFr Bld: 6.7 % — ABNORMAL HIGH (ref 4.6–6.5)

## 2020-06-26 LAB — TSH: TSH: 1.14 u[IU]/mL (ref 0.35–4.50)

## 2020-06-26 NOTE — Patient Instructions (Signed)
Health Maintenance, Female Adopting a healthy lifestyle and getting preventive care are important in promoting health and wellness. Ask your health care provider about:  The right schedule for you to have regular tests and exams.  Things you can do on your own to prevent diseases and keep yourself healthy. What should I know about diet, weight, and exercise? Eat a healthy diet   Eat a diet that includes plenty of vegetables, fruits, low-fat dairy products, and lean protein.  Do not eat a lot of foods that are high in solid fats, added sugars, or sodium. Maintain a healthy weight Body mass index (BMI) is used to identify weight problems. It estimates body fat based on height and weight. Your health care provider can help determine your BMI and help you achieve or maintain a healthy weight. Get regular exercise Get regular exercise. This is one of the most important things you can do for your health. Most adults should:  Exercise for at least 150 minutes each week. The exercise should increase your heart rate and make you sweat (moderate-intensity exercise).  Do strengthening exercises at least twice a week. This is in addition to the moderate-intensity exercise.  Spend less time sitting. Even light physical activity can be beneficial. Watch cholesterol and blood lipids Have your blood tested for lipids and cholesterol at 51 years of age, then have this test every 5 years. Have your cholesterol levels checked more often if:  Your lipid or cholesterol levels are high.  You are older than 51 years of age.  You are at high risk for heart disease. What should I know about cancer screening? Depending on your health history and family history, you may need to have cancer screening at various ages. This may include screening for:  Breast cancer.  Cervical cancer.  Colorectal cancer.  Skin cancer.  Lung cancer. What should I know about heart disease, diabetes, and high blood  pressure? Blood pressure and heart disease  High blood pressure causes heart disease and increases the risk of stroke. This is more likely to develop in people who have high blood pressure readings, are of African descent, or are overweight.  Have your blood pressure checked: ? Every 3-5 years if you are 18-39 years of age. ? Every year if you are 40 years old or older. Diabetes Have regular diabetes screenings. This checks your fasting blood sugar level. Have the screening done:  Once every three years after age 40 if you are at a normal weight and have a low risk for diabetes.  More often and at a younger age if you are overweight or have a high risk for diabetes. What should I know about preventing infection? Hepatitis B If you have a higher risk for hepatitis B, you should be screened for this virus. Talk with your health care provider to find out if you are at risk for hepatitis B infection. Hepatitis C Testing is recommended for:  Everyone born from 1945 through 1965.  Anyone with known risk factors for hepatitis C. Sexually transmitted infections (STIs)  Get screened for STIs, including gonorrhea and chlamydia, if: ? You are sexually active and are younger than 51 years of age. ? You are older than 51 years of age and your health care provider tells you that you are at risk for this type of infection. ? Your sexual activity has changed since you were last screened, and you are at increased risk for chlamydia or gonorrhea. Ask your health care provider if   you are at risk.  Ask your health care provider about whether you are at high risk for HIV. Your health care provider may recommend a prescription medicine to help prevent HIV infection. If you choose to take medicine to prevent HIV, you should first get tested for HIV. You should then be tested every 3 months for as long as you are taking the medicine. Pregnancy  If you are about to stop having your period (premenopausal) and  you may become pregnant, seek counseling before you get pregnant.  Take 400 to 800 micrograms (mcg) of folic acid every day if you become pregnant.  Ask for birth control (contraception) if you want to prevent pregnancy. Osteoporosis and menopause Osteoporosis is a disease in which the bones lose minerals and strength with aging. This can result in bone fractures. If you are 65 years old or older, or if you are at risk for osteoporosis and fractures, ask your health care provider if you should:  Be screened for bone loss.  Take a calcium or vitamin D supplement to lower your risk of fractures.  Be given hormone replacement therapy (HRT) to treat symptoms of menopause. Follow these instructions at home: Lifestyle  Do not use any products that contain nicotine or tobacco, such as cigarettes, e-cigarettes, and chewing tobacco. If you need help quitting, ask your health care provider.  Do not use street drugs.  Do not share needles.  Ask your health care provider for help if you need support or information about quitting drugs. Alcohol use  Do not drink alcohol if: ? Your health care provider tells you not to drink. ? You are pregnant, may be pregnant, or are planning to become pregnant.  If you drink alcohol: ? Limit how much you use to 0-1 drink a day. ? Limit intake if you are breastfeeding.  Be aware of how much alcohol is in your drink. In the [place] one drink equals one 12 oz bottle of beer (355 mL), one 5 oz glass of wine (148 mL), or one 1 oz glass of hard liquor (44 mL). General instructions  Schedule regular health, dental, and eye exams.  Stay current with your vaccines.  Tell your health care provider if: ? You often feel depressed. ? You have ever been abused or do not feel safe at home. Summary  Adopting a healthy lifestyle and getting preventive care are important in promoting health and wellness.  Follow your health care provider's instructions about healthy  diet, exercising, and getting tested or screened for diseases.  Follow your health care provider's instructions on monitoring your cholesterol and blood pressure. This information is not intended to replace advice given to you by your health care provider. Make sure you discuss any questions you have with your health care provider. Document Revised: 09/19/2018 Document Reviewed: 09/19/2018 Elsevier Patient Education  2020 [employer]  

## 2020-06-26 NOTE — Progress Notes (Signed)
Office Note 06/26/2020  CC:  Chief Complaint  Patient presents with   Annual Exam    CPE    HPI:  Tiffany Hicks is a 49 y.o. [place] female who is here for annual health maintenance exam and f/u DM 2 and HLD. Doing well.  Kids are 58 and 42 y/o now. Working from home for [employer] as well.  A/P as of last visit: "1) OSA suspected: refer to sleep MD at [facility]. Quality of sleep questionnaire today, score of 13= very high risk of OSA.  2) DM 2: good control per home monitoring. Diet controlled. Needs to improve diet and increase exercise. HbA1c future.  Urine microalb/cr next f/u visit in 3 mo.  3) Hyperlipidemia: stopped 80 mg dose of lipitor b/c felt bad but also wasn't sure if it had anything to do with her getting a kidney stone. Reassured her that it was not likely related to her getting kidney stone. However, hold off on restart of atorva until urol eval.  If stone gone then restart atorva 1/2 of 55m tab qd.  IF doesn't tolerate this dosing then call and report it to me.  If tolerates this dose then increase to 80 mg qd dosing after 2-3 wks.  4) Health maintenance exam: Reviewed age and gender appropriate health maintenance issues (prudent diet, regular exercise, health risks of tobacco and excessive alcohol, use of seatbelts, fire alarms in home, use of sunscreen).  Also reviewed age and gender appropriate health screening as well as vaccine recommendations. Vaccines: flu vaccine->given today.   Pneumovax 23-->given today.   Shingrix--->#1 given today. Labs: return for fasting lab visit--> health panel + Hba1c (DM2). Cervical ca screening: Dr. MMickle Plumbwas done in early 2020--normal per pt report. Breast ca screening: 09/2017->neg (Dr. MRadene Knee.  Repeat early 2020-->normal per pt report.. Colon ca screening: due for initial screening colonoscopy-->she defers this until next year."   INTERIM HX: Feeling fine.  DM:  approx monthly fasting  glucose checks---usually around 120. Feet: no burning, tingling, or numbness.  HLD: tolerating this med w/out problem  She giggles when I ask if she's eating healthy diet. 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   Past Medical History:  Diagnosis Date   Allergic rhinitis    Allergy testing approx 2012 and 2019.   Diabetes mellitus ([place] 05/2018   Hba1c 6.5%   Food allergy    cucumber, mushroom, orange, sesame seed   Gestational diabetes    Both pregnancies.   Hyperlipemia, mixed 05/2018   Great response to statin   Left carpal tunnel syndrome    Migraines    Multiple thyroid nodules    FNA of one nodule: benign follic nodule/cyst on 22/5/85  f/u u/s 08/2019 stable nodules, no criteria for bx.  Annual repeat rec'd until stable for 5 yrs (Dr. MRadene Knee.   Right ureteral stone 11/2018   MET->urol f/u 12/2018->no impr->cont MET + pain med, discussion of ureteroscopy vs ESWL was done and pt considering.   Ureterolithiasis    ureteroscopy with holmium laser 08/05/19. CaOx.    Past Surgical History:  Procedure Laterality Date   BIOPSY THYROID Right 11/15/2017   benign.     CESAREAN SECTION  08/03/2011   Procedure: CESAREAN SECTION;  Surgeon: RMargarette Asal  [facility];  Service: Gynecology;  Laterality: N/A;   CYSTOSCOPY WITH RETROGRADE PYELOGRAM, URETEROSCOPY AND STENT PLACEMENT Right 08/05/2019   Procedure: CYSTOSCOPY WITH RETROGRADE PYELOGRAM, URETEROSCOPY AND STENT  PLACEMENTwith laser;  Surgeon: Franchot Gallo, MD;  Location: [facility];  Service: Urology;  Laterality: Right;  1 HR   HOLMIUM LASER APPLICATION Right 62/94/7654   Procedure: HOLMIUM LASER APPLICATION;  Surgeon: Franchot Gallo, MD;  Location: [facility];  Service: Urology;  Laterality: Right;   WISDOM TOOTH EXTRACTION  2000    Family History  Problem Relation Age of Onset   Hypertension Mother    Diabetes Mother    Lung cancer Mother        stage 36,  non-smoker   Hypertension Father    Hyperlipidemia Father    Diabetes Sister    Hyperlipidemia Brother    Diabetes Brother    Diabetes Brother    Hyperlipidemia Brother    Colon polyps Neg Hx    Colon cancer Neg Hx    Esophageal cancer Neg Hx    Stomach cancer Neg Hx    Rectal cancer Neg Hx     Social History   Socioeconomic History   Marital status: Married    Spouse name: Not on file   Number of children: Not on file   Years of education: Not on file   Highest education level: Not on file  Occupational History   Not on file  Tobacco Use   Smoking status: Never Smoker   Smokeless tobacco: Never Used  Vaping Use   Vaping Use: Never used  Substance and Sexual Activity   Alcohol use: No   Drug use: No   Sexual activity: Yes    Birth control/protection: None  Other Topics Concern   Not on file  Social History Narrative   Married, 2 children.   Lives in [place].   Educ: Masters   Personnel officer at [employer].   Social Determinants of Health   Financial Resource Strain:    Difficulty of Paying Living Expenses: Not on file  Food Insecurity:    Worried About Charity fundraiser in the Last Year: Not on file   [employer] of Food in the Last Year: Not on file  Transportation Needs:    Lack of Transportation (Medical): Not on file   Lack of Transportation (Non-Medical): Not on file  Physical Activity:    Days of Exercise per Week: Not on file   Minutes of Exercise per Session: Not on file  Stress:    Feeling of Stress : Not on file  Social Connections:    Frequency of Communication with Friends and Family: Not on file   Frequency of Social Gatherings with Friends and Family: Not on file   Attends Religious Services: Not on file   Active Member of Clubs or Organizations: Not on file   Attends Archivist Meetings: Not on file   Marital Status: Not on file  Intimate Partner Violence:    Fear of Current or Ex-Partner:  Not on file   Emotionally Abused: Not on file   Physically Abused: Not on file   Sexually Abused: Not on file    Outpatient Medications Prior to Visit  Medication Sig Dispense Refill   atorvastatin (LIPITOR) 80 MG tablet TAKE ONE TABLET BY MOUTH DAILY 90 tablet 3   cetirizine (ZYRTEC ALLERGY) 10 MG tablet Take 10 mg by mouth daily.     Sodium Sulfate-Mag Sulfate-KCl (SUTAB) [phone] MG TABS Take 1 kit by mouth as directed. MANUFACTURER CODES!! BIN: [id number] PCN: CN GROUP: [id number] MEMBER ID: [id number];MBW AS CASH;NO PRIOR AUTHORIZATION 24 tablet 0   No facility-administered medications prior  to visit.    Allergies  Allergen Reactions   Cucumber Extract Rash   Mushroom Extract Complex Rash   Peanut-Containing Drug Products Rash   Soy Allergy Rash    ROS Review of Systems  Constitutional: Negative for appetite change, chills, fatigue and fever.  HENT: Negative for congestion, dental problem, ear pain and sore throat.   Eyes: Negative for discharge, redness and visual disturbance.  Respiratory: Negative for cough, chest tightness, shortness of breath and wheezing.   Cardiovascular: Negative for chest pain, palpitations and leg swelling.  Gastrointestinal: Negative for abdominal pain, blood in stool, diarrhea, nausea and vomiting.  Genitourinary: Negative for difficulty urinating, dysuria, flank pain, frequency, hematuria and urgency.  Musculoskeletal: Negative for arthralgias, back pain, joint swelling, myalgias and neck stiffness.  Skin: Negative for pallor and rash.  Neurological: Negative for dizziness, speech difficulty, weakness and headaches.  Hematological: Negative for adenopathy. Does not bruise/bleed easily.  Psychiatric/Behavioral: Negative for confusion and sleep disturbance. The patient is not nervous/anxious.     PE; Vitals with BMI 06/26/2020 06/23/2020 10/02/2019  Height 5' 0.433" 5' 0"  5' 0"   Weight 143 lbs 141 lbs 139 lbs 6 oz  BMI 27.53 18.84  16.60  Systolic 630 - 160  Diastolic 87 - 86  Pulse 72 - 70    Exam chaperoned by Shepard General, CMA  Gen: Alert, well appearing.  Patient is oriented to person, place, time, and situation. AFFECT: pleasant, lucid thought and speech. ENT: Ears: EACs clear, normal epithelium.  TMs with good light reflex and landmarks bilaterally.  Eyes: no injection, icteris, swelling, or exudate.  EOMI, PERRLA. Nose: no drainage or turbinate edema/swelling.  No injection or focal lesion.  Mouth: lips without lesion/swelling.  Oral mucosa pink and moist.  Dentition intact and without obvious caries or gingival swelling.  Oropharynx without erythema, exudate, or swelling.  Neck: supple/nontender.  No LAD, mass, or TM.  Carotid pulses 2+ bilaterally, without bruits. CV: RRR, no m/r/g.   LUNGS: CTA bilat, nonlabored resps, good aeration in all lung fields. ABD: soft, NT, ND, BS normal.  No hepatospenomegaly or mass.  No bruits. EXT: no clubbing, cyanosis, or edema.  Musculoskeletal: no joint swelling, erythema, warmth, or tenderness.  ROM of all joints intact. Skin - no sores or suspicious lesions or rashes or color changes   Pertinent labs:  Lab Results  Component Value Date   TSH 1.10 07/01/2019   Lab Results  Component Value Date   WBC 6.6 08/02/2019   HGB 13.0 08/02/2019   HCT 40.0 08/02/2019   MCV 90.7 08/02/2019   PLT 296 08/02/2019   Lab Results  Component Value Date   CREATININE 0.55 09/25/2019   BUN 11 09/25/2019   NA 138 09/25/2019   K 4.0 09/25/2019   CL 104 09/25/2019   CO2 29 09/25/2019   Lab Results  Component Value Date   ALT 21 09/25/2019   AST 14 09/25/2019   ALKPHOS 81 09/25/2019   BILITOT 0.7 09/25/2019   Lab Results  Component Value Date   CHOL 138 09/25/2019   Lab Results  Component Value Date   HDL 43.00 09/25/2019   Lab Results  Component Value Date   LDLCALC 75 09/25/2019   Lab Results  Component Value Date   TRIG 101.0 09/25/2019   Lab Results   Component Value Date   CHOLHDL 3 09/25/2019   Lab Results  Component Value Date   HGBA1C 6.6 (H) 09/25/2019    ASSESSMENT AND PLAN:  1) DM--diet only. Needs to work on [place]. Feet exam today normal. HbA1c today.  Fasting gluc today.  2) HLD: tolerating high dose atorva. FLP today, hepatic panel today.  3) Multiple thyroid nodules: she'll be getting surveillance ultrasounds via Dr. Radene Knee. Most recent 08/2019 stable, no bx's needed.  Needs annual repeat u/s until stable x 5 yrs.  4) Health maintenance exam: Reviewed age and gender appropriate health maintenance issues (prudent diet, regular exercise, health risks of tobacco and excessive alcohol, use of seatbelts, fire alarms in home, use of sunscreen).  Also reviewed age and gender appropriate health screening as well as vaccine recommendations. Vaccines: Covid 19->UTD.  Flu->given today. Labs: fasting HP, Hba1c (DM, HLD). Cervical ca screening: has f/u with GYN early next year and likely will be due for pap. Breast ca screening: mammograms via GYN office. Colon ca screening: due for this->has appt in 3d to get colonoscopy.  An After Visit Summary was printed and given to the patient.  FOLLOW UP:  Return in about 6 months (around 12/24/2020) for routine chronic illness f/u.  Signed:  [name], MD           06/26/2020

## 2020-06-28 ENCOUNTER — Encounter: Payer: Self-pay | Admitting: Certified Registered Nurse Anesthetist

## 2020-06-29 ENCOUNTER — Ambulatory Visit (AMBULATORY_SURGERY_CENTER): Payer: Commercial Managed Care - PPO | Admitting: Gastroenterology

## 2020-06-29 ENCOUNTER — Encounter: Payer: Self-pay | Admitting: Gastroenterology

## 2020-06-29 ENCOUNTER — Encounter: Payer: Commercial Managed Care - PPO | Admitting: Gastroenterology

## 2020-06-29 ENCOUNTER — Other Ambulatory Visit: Payer: Self-pay

## 2020-06-29 VITALS — BP 135/86 | HR 62 | Temp 97.3°F | Resp 20 | Ht 60.0 in | Wt 141.0 lb

## 2020-06-29 DIAGNOSIS — D125 Benign neoplasm of sigmoid colon: Secondary | ICD-10-CM

## 2020-06-29 DIAGNOSIS — Z1211 Encounter for screening for malignant neoplasm of colon: Secondary | ICD-10-CM

## 2020-06-29 DIAGNOSIS — K635 Polyp of colon: Secondary | ICD-10-CM

## 2020-06-29 DIAGNOSIS — K6289 Other specified diseases of anus and rectum: Secondary | ICD-10-CM

## 2020-06-29 HISTORY — PX: COLONOSCOPY: SHX174

## 2020-06-29 MED ORDER — SODIUM CHLORIDE 0.9 % IV SOLN: 500.0000 mL | Freq: Once | INTRAVENOUS | Status: DC

## 2020-06-29 NOTE — Patient Instructions (Signed)
Handouts provided on polyps, diverticulosis and hemorrhoids.   YOU HAD AN ENDOSCOPIC PROCEDURE TODAY AT THE [facility]:   Refer to the procedure report that was given to you for any specific questions about what was found during the examination.  If the procedure report does not answer your questions, please call your gastroenterologist to clarify.  If you requested that your care partner not be given the details of your procedure findings, then the procedure report has been included in a sealed envelope for you to review at your convenience later.  YOU SHOULD EXPECT: Some feelings of bloating in the abdomen. Passage of more gas than usual.  Walking can help get rid of the air that was put into your GI tract during the procedure and reduce the bloating. If you had a lower endoscopy (such as a colonoscopy or flexible sigmoidoscopy) you may notice spotting of blood in your stool or on the toilet paper. If you underwent a bowel prep for your procedure, you may not have a normal bowel movement for a few days.  Please Note:  You might notice some irritation and congestion in your nose or some drainage.  This is from the oxygen used during your procedure.  There is no need for concern and it should clear up in a day or so.  SYMPTOMS TO REPORT IMMEDIATELY:   Following lower endoscopy (colonoscopy or flexible sigmoidoscopy):  Excessive amounts of blood in the stool  Significant tenderness or worsening of abdominal pains  Swelling of the abdomen that is new, acute  Fever of 100F or higher   For urgent or emergent issues, a gastroenterologist can be reached at any hour by calling [phone]. Do not use MyChart messaging for urgent concerns.    DIET:  We do recommend a small meal at first, but then you may proceed to your regular diet.  Drink plenty of fluids but you should avoid alcoholic beverages for 24 hours.  ACTIVITY:  You should plan to take it easy for the rest of today and  you should NOT DRIVE or use heavy machinery until tomorrow (because of the sedation medicines used during the test).    FOLLOW UP: Our staff will call the number listed on your records 48-72 hours following your procedure to check on you and address any questions or concerns that you may have regarding the information given to you following your procedure. If we do not reach you, we will leave a message.  We will attempt to reach you two times.  During this call, we will ask if you have developed any symptoms of COVID 19. If you develop any symptoms (ie: fever, flu-like symptoms, shortness of breath, cough etc.) before then, please call [phone].  If you test positive for Covid 19 in the 2 weeks post procedure, please call and report this information to us.    If any biopsies were taken you will be contacted by phone or by letter within the next 1-3 weeks.  Please call us at [phone] if you have not heard about the biopsies in 3 weeks.    SIGNATURES/CONFIDENTIALITY: You and/or your care partner have signed paperwork which will be entered into your electronic medical record.  These signatures attest to the fact that that the information above on your After Visit Summary has been reviewed and is understood.  Full responsibility of the confidentiality of this discharge information lies with you and/or your care-partner.  

## 2020-06-29 NOTE — Op Note (Signed)
Center Patient Name: Tiffany Hicks Procedure Date: 06/29/2020 8:09 AM MRN: [id number] Endoscopist: Remo Lipps P. Havery Moros , MD Age: 49 Referring MD:  Date of Birth: August 18, 1971 Gender: Female Account #: [id number] Procedure:                Colonoscopy Indications:              Screening for colorectal malignant neoplasm, This                            is the patient's first colonoscopy Medicines:                Monitored Anesthesia Care Procedure:                Pre-Anesthesia Assessment:                           - Prior to the procedure, a History and Physical                            was performed, and patient medications and                            allergies were reviewed. The patient's tolerance of                            previous anesthesia was also reviewed. The risks                            and benefits of the procedure and the sedation                            options and risks were discussed with the patient.                            All questions were answered, and informed consent                            was obtained. Prior Anticoagulants: The patient has                            taken no previous anticoagulant or antiplatelet                            agents. ASA Grade Assessment: II - A patient with                            mild systemic disease. After reviewing the risks                            and benefits, the patient was deemed in                            satisfactory condition to undergo the procedure.  After obtaining informed consent, the colonoscope                            was passed under direct vision. Throughout the                            procedure, the patient's blood pressure, pulse, and                            oxygen saturations were monitored continuously. The                            Colonoscope was introduced through the anus and                            advanced to the the  cecum, identified by                            appendiceal orifice and ileocecal valve. The                            colonoscopy was performed without difficulty. The                            patient tolerated the procedure well. The quality                            of the bowel preparation was good. The ileocecal                            valve, appendiceal orifice, and rectum were                            photographed. Scope In: 8:24:34 AM Scope Out: 8:39:47 AM Scope Withdrawal Time: 0 hours 13 minutes 56 seconds  Total Procedure Duration: 0 hours 15 minutes 13 seconds  Findings:                 Skin tags were found on perianal exam.                           Scattered small-mouthed diverticula were found in                            the transverse colon and left colon.                           A diminutive polyp was found in the sigmoid colon.                            The polyp was sessile. The polyp was removed with a                            cold biopsy forceps. Resection and retrieval were  complete.                           Anal papillae was hypertrophied. Biopsies were                            taken with a cold forceps for histology to rule out                            AIN.                           Internal hemorrhoids were found during                            retroflexion. The hemorrhoids were small.                           The exam was otherwise without abnormality. Complications:            No immediate complications. Estimated blood loss:                            Minimal. Estimated Blood Loss:     Estimated blood loss was minimal. Impression:               - Perianal skin tags found on perianal exam.                           - Diverticulosis in the transverse colon and in the                            left colon.                           - One diminutive polyp in the sigmoid colon,                            removed  with a cold biopsy forceps. Resected and                            retrieved.                           - Anal papilla(e) was hypertrophied. Biopsied to                            rule out AIN.                           - Internal hemorrhoids.                           - The examination was otherwise normal. Recommendation:           - Patient has a contact number available for  emergencies. The signs and symptoms of potential                            delayed complications were discussed with the                            patient. Return to normal activities tomorrow.                            Written discharge instructions were provided to the                            patient.                           - Resume previous diet.                           - Continue present medications.                           - Await pathology results. Remo Lipps P. Cashtyn Pouliot, MD 06/29/2020 8:43:25 AM This report has been signed electronically.

## 2020-06-29 NOTE — Progress Notes (Signed)
Called to room to assist during endoscopic procedure.  Patient ID and intended procedure confirmed with present staff. Received instructions for my participation in the procedure from the performing physician.  

## 2020-06-29 NOTE — Progress Notes (Signed)
Pt's states no medical or surgical changes since previsit or office visit. VS by AG 

## 2020-06-29 NOTE — Progress Notes (Signed)
Report given to PACU, vss 

## 2020-06-30 MED ORDER — EZETIMIBE 10 MG PO TABS: 10.0000 mg | ORAL_TABLET | Freq: Every day | ORAL | 3 refills | Status: DC

## 2020-06-30 NOTE — Progress Notes (Signed)
Verified pt understanding. Rx sent to pharmacy

## 2020-06-30 NOTE — Addendum Note (Signed)
Addended by: Octaviano Glow on: 06/30/2020 08:01 AM   Modules accepted: Orders

## 2020-07-01 ENCOUNTER — Telehealth: Payer: Self-pay | Admitting: *Deleted

## 2020-07-01 NOTE — Telephone Encounter (Signed)
  Follow up Call-  Call back number 06/29/2020  Post procedure Call Back phone  # [phone]  Permission to leave phone message Yes  Some recent data might be hidden     Patient questions:  Do you have a fever, pain , or abdominal swelling? No. Pain Score  0 *  Have you tolerated food without any problems? Yes.    Have you been able to return to your normal activities? Yes.    Do you have any questions about your discharge instructions: Diet   No. Medications  No. Follow up visit  No.  Do you have questions or concerns about your Care? No.  Actions: * If pain score is 4 or above: 1. No action needed, pain <4.Have you developed a fever since your procedure? no  2.   Have you had an respiratory symptoms (SOB or cough) since your procedure? no  3.   Have you tested positive for COVID 19 since your procedure no  4.   Have you had any family members/close contacts diagnosed with the COVID 19 since your procedure?  no   If yes to any of these questions please route to Joylene John, RN and Joella Prince, RN

## 2020-08-28 ENCOUNTER — Other Ambulatory Visit: Payer: Self-pay | Admitting: Obstetrics and Gynecology

## 2020-08-28 DIAGNOSIS — Z1231 Encounter for screening mammogram for malignant neoplasm of breast: Secondary | ICD-10-CM

## 2020-09-22 ENCOUNTER — Other Ambulatory Visit: Payer: Self-pay | Admitting: Family Medicine

## 2020-09-28 ENCOUNTER — Encounter: Payer: Self-pay | Admitting: Family Medicine

## 2020-11-11 ENCOUNTER — Ambulatory Visit: Payer: Commercial Managed Care - PPO

## 2020-12-02 ENCOUNTER — Inpatient Hospital Stay: Admission: RE | Admit: 2020-12-02 | Payer: Commercial Managed Care - PPO | Source: Ambulatory Visit

## 2020-12-17 LAB — RESULTS CONSOLE HPV: CHL HPV: NEGATIVE

## 2020-12-17 LAB — HM MAMMOGRAPHY

## 2020-12-18 ENCOUNTER — Other Ambulatory Visit: Payer: Self-pay | Admitting: Obstetrics and Gynecology

## 2020-12-18 DIAGNOSIS — E041 Nontoxic single thyroid nodule: Secondary | ICD-10-CM

## 2020-12-23 LAB — HM PAP SMEAR: HM Pap smear: NORMAL

## 2020-12-31 ENCOUNTER — Ambulatory Visit: Payer: Commercial Managed Care - PPO | Admitting: Family Medicine

## 2021-01-12 ENCOUNTER — Ambulatory Visit
Admission: RE | Admit: 2021-01-12 | Discharge: 2021-01-12 | Disposition: A | Discharge status: Home / Self Care | Payer: Commercial Managed Care - PPO | Source: Ambulatory Visit | Attending: Obstetrics and Gynecology | Admitting: Obstetrics and Gynecology

## 2021-01-12 DIAGNOSIS — E041 Nontoxic single thyroid nodule: Secondary | ICD-10-CM

## 2021-01-28 ENCOUNTER — Ambulatory Visit: Payer: Commercial Managed Care - PPO

## 2021-04-23 ENCOUNTER — Ambulatory Visit: Payer: Commercial Managed Care - PPO

## 2021-05-31 LAB — HM DIABETES EYE EXAM

## 2021-06-10 DIAGNOSIS — K5790 Diverticulosis of intestine, part unspecified, without perforation or abscess without bleeding: Secondary | ICD-10-CM

## 2021-06-10 HISTORY — DX: Diverticulosis of intestine, part unspecified, without perforation or abscess without bleeding: K57.90

## 2021-06-25 ENCOUNTER — Other Ambulatory Visit: Payer: Self-pay

## 2021-06-29 ENCOUNTER — Ambulatory Visit (INDEPENDENT_AMBULATORY_CARE_PROVIDER_SITE_OTHER): Payer: Commercial Managed Care - PPO | Admitting: Family Medicine

## 2021-06-29 ENCOUNTER — Encounter: Payer: Self-pay | Admitting: Family Medicine

## 2021-06-29 ENCOUNTER — Other Ambulatory Visit: Payer: Self-pay

## 2021-06-29 VITALS — BP 117/74 | HR 73 | Temp 98.0°F | Resp 16 | Ht 60.0 in | Wt 146.8 lb

## 2021-06-29 DIAGNOSIS — Z23 Encounter for immunization: Secondary | ICD-10-CM

## 2021-06-29 DIAGNOSIS — E7800 Pure hypercholesterolemia, unspecified: Secondary | ICD-10-CM | POA: Diagnosis not present

## 2021-06-29 DIAGNOSIS — J309 Allergic rhinitis, unspecified: Secondary | ICD-10-CM | POA: Diagnosis not present

## 2021-06-29 DIAGNOSIS — Z0000 Encounter for general adult medical examination without abnormal findings: Secondary | ICD-10-CM

## 2021-06-29 DIAGNOSIS — E119 Type 2 diabetes mellitus without complications: Secondary | ICD-10-CM

## 2021-06-29 LAB — HEMOGLOBIN A1C: Hgb A1c MFr Bld: 8.2 % — ABNORMAL HIGH (ref 4.6–6.5)

## 2021-06-29 LAB — TSH: TSH: 1 u[IU]/mL (ref 0.35–5.50)

## 2021-06-29 MED ORDER — ATORVASTATIN CALCIUM 80 MG PO TABS: 80.0000 mg | ORAL_TABLET | Freq: Every day | ORAL | 3 refills | Status: DC

## 2021-06-29 NOTE — Progress Notes (Addendum)
Office Note 06/29/2021  CC:  Chief Complaint  Patient presents with   Annual Exam    Pt is fasting    HPI:  Tiffany Hicks is a 50 y.o. female who is here for annual health maintenance exam and f/u diet controlled DM and HLD. A/P as of last visit: "1) DM--diet only. Needs to work on [place]. Feet exam today normal. HbA1c today.  Fasting gluc today.   2) HLD: tolerating high dose atorva. FLP today, hepatic panel today.   3) Multiple thyroid nodules: she'll be getting surveillance ultrasounds via Dr. Radene Knee. Most recent 08/2019 stable, no bx's needed.  Needs annual repeat u/s until stable x 5 yrs.   4) Health maintenance exam: Reviewed age and gender appropriate health maintenance issues (prudent diet, regular exercise, health risks of tobacco and excessive alcohol, use of seatbelts, fire alarms in home, use of sunscreen).  Also reviewed age and gender appropriate health screening as well as vaccine recommendations. Vaccines: Covid 19->UTD.  Flu->given today. Labs: fasting HP, Hba1c (DM, HLD). Cervical ca screening: has f/u with GYN early next year and likely will be due for pap. Breast ca screening: mammograms via GYN office. Colon ca screening: due for this->has appt in 3d to get colonoscopy."  INTERIM HX: Doing fine. Still with Sherril Croon.       Past Medical History:  Diagnosis Date   Allergic rhinitis    Allergy testing approx 2012 and 2019.   Diabetes mellitus ([place]) 05/2018   Hba1c 6.5%   Diverticulosis 06/2021   colonoscopy   Food allergy    cucumber, mushroom, orange, sesame seed   Gestational diabetes    Both pregnancies.   Hyperlipemia, mixed 05/2018   Great response to statin   Left carpal tunnel syndrome    Migraines    Multiple thyroid nodules    FNA of one nodule: benign follic nodule/cyst on 12/09/93.  f/u u/s 08/2019 stable nodules, no criteria for bx.  Rpt 01/2021->2 new nodules, need f/u u/s 1 yr.  Annual repeat rec'd until stable for 5 yrs (Dr.  Radene Knee).   Ureterolithiasis    R Ureteroscopy with holmium laser 08/05/19. CaOx.    Past Surgical History:  Procedure Laterality Date   BIOPSY THYROID Right 11/15/2017   benign.     CESAREAN SECTION  08/03/2011   Procedure: CESAREAN SECTION;  Surgeon: Margarette Asal;  Location: [facility];  Service: Gynecology;  Laterality: N/A;   COLONOSCOPY  06/29/2020   no adenomas. Recall 2031   CYSTOSCOPY WITH RETROGRADE PYELOGRAM, URETEROSCOPY AND STENT PLACEMENT Right 08/05/2019   Procedure: CYSTOSCOPY WITH RETROGRADE PYELOGRAM, URETEROSCOPY AND STENT PLACEMENTwith laser;  Surgeon: Franchot Gallo, MD;  Location: [facility];  Service: Urology;  Laterality: Right;  1 HR   HOLMIUM LASER APPLICATION Right 18/84/1660   Procedure: HOLMIUM LASER APPLICATION;  Surgeon: Franchot Gallo, MD;  Location: [facility];  Service: Urology;  Laterality: Right;   WISDOM TOOTH EXTRACTION  2000    Family History  Problem Relation Age of Onset   Hypertension Mother    Diabetes Mother    Lung cancer Mother        stage 96, non-smoker   Hypertension Father    Hyperlipidemia Father    Diabetes Sister    Hyperlipidemia Brother    Diabetes Brother    Diabetes Brother    Hyperlipidemia Brother    Colon polyps Neg Hx    Colon cancer Neg Hx    Esophageal cancer Neg Hx    Stomach cancer  Neg Hx    Rectal cancer Neg Hx     Social History   Socioeconomic History   Marital status: Married    Spouse name: Not on file   Number of children: Not on file   Years of education: Not on file   Highest education level: Not on file  Occupational History   Not on file  Tobacco Use   Smoking status: Never   Smokeless tobacco: Never  Vaping Use   Vaping Use: Never used  Substance and Sexual Activity   Alcohol use: No   Drug use: No   Sexual activity: Yes    Birth control/protection: None  Other Topics Concern   Not on file  Social History Narrative   Married, 2 children.   Lives in [place].   Educ: Masters    Personnel officer at [employer].   Social Determinants of Health   Financial Resource Strain: Not on file  Food Insecurity: Not on file  Transportation Needs: Not on file  Physical Activity: Not on file  Stress: Not on file  Social Connections: Not on file  Intimate Partner Violence: Not on file    Outpatient Medications Prior to Visit  Medication Sig Dispense Refill   azelastine (ASTELIN) 0.1 % nasal spray azelastine 137 mcg (0.1 %) nasal spray aerosol  USE TWO SPRAYS BY NASAL ROUTE TWICE DAILY (Patient not taking: Reported on 06/29/2021)     cetirizine (ZYRTEC) 10 MG tablet Take 10 mg by mouth daily. (Patient not taking: Reported on 06/29/2021)     ezetimibe (ZETIA) 10 MG tablet Take 1 tablet (10 mg total) by mouth daily. (Patient not taking: Reported on 06/29/2021) 30 tablet 3   fluticasone (FLONASE) 50 MCG/ACT nasal spray fluticasone propionate 50 mcg/actuation nasal spray,suspension  USE 2 SPRAYS IN EACH NOSTRIL ONCE DAILY (Patient not taking: Reported on 06/29/2021)     levocetirizine (XYZAL) 5 MG tablet levocetirizine 5 mg tablet  TAKE 1 TABLET BY MOUTH EVERY EVENING (Patient not taking: Reported on 06/29/2021)     atorvastatin (LIPITOR) 80 MG tablet TAKE ONE TABLET BY MOUTH DAILY 30 tablet 3   No facility-administered medications prior to visit.    Allergies  Allergen Reactions   Cucumber Extract Rash   Mushroom Extract Complex Rash   Peanut-Containing Drug Products Rash   Soy Allergy Rash    ROS Review of Systems  Constitutional:  Negative for appetite change, chills, fatigue and fever.  HENT:  Negative for congestion, dental problem, ear pain and sore throat.   Eyes:  Negative for discharge, redness and visual disturbance.  Respiratory:  Negative for cough, chest tightness, shortness of breath and wheezing.   Cardiovascular:  Negative for chest pain, palpitations and leg swelling.  Gastrointestinal:  Negative for abdominal pain, blood in stool, diarrhea, nausea and  vomiting.  Genitourinary:  Negative for difficulty urinating, dysuria, flank pain, frequency, hematuria and urgency.  Musculoskeletal:  Negative for arthralgias, back pain, joint swelling, myalgias and neck stiffness.  Skin:  Negative for pallor and rash.  Neurological:  Negative for dizziness, speech difficulty, weakness and headaches.  Hematological:  Negative for adenopathy. Does not bruise/bleed easily.  Psychiatric/Behavioral:  Negative for confusion and sleep disturbance. The patient is not nervous/anxious.    PE; Vitals with BMI 06/29/2021 06/29/2020 06/29/2020  Height 5\' 0"  - -  Weight 146 lbs 13 oz - -  BMI 06.26 - -  Systolic 948 546 270  Diastolic 74 86 70  Pulse 73 62 67  Exam chaperoned by Shepard General, CMA   Gen: Alert, well appearing.  Patient is oriented to person, place, time, and situation. AFFECT: pleasant, lucid thought and speech. ENT: Ears: EACs clear, normal epithelium.  TMs with good light reflex and landmarks bilaterally.  Eyes: no injection, icteris, swelling, or exudate.  EOMI, PERRLA. Nose: no drainage or turbinate edema/swelling.  No injection or focal lesion.  Mouth: lips without lesion/swelling.  Oral mucosa pink and moist.  Dentition intact and without obvious caries or gingival swelling.  Oropharynx without erythema, exudate, or swelling.  Neck: supple/nontender.  No LAD, mass, or TM.  Carotid pulses 2+ bilaterally, without bruits. CV: RRR, no m/r/g.   LUNGS: CTA bilat, nonlabored resps, good aeration in all lung fields. ABD: soft, NT, ND, BS normal.  No hepatospenomegaly or mass.  No bruits. EXT: no clubbing, cyanosis, or edema.  Musculoskeletal: no joint swelling, erythema, warmth, or tenderness.  ROM of all joints intact. Skin - no sores or suspicious lesions or rashes or color changes. Foot exam - no swelling, tenderness or skin or vascular lesions. Color and temperature is normal. Sensation is intact. Peripheral pulses are palpable. Toenails are  normal.   Pertinent labs:  Lab Results  Component Value Date   TSH 1.14 06/26/2020   Lab Results  Component Value Date   WBC 6.9 06/26/2020   HGB 12.9 06/26/2020   HCT 38.5 06/26/2020   MCV 90.5 06/26/2020   PLT 308.0 06/26/2020   Lab Results  Component Value Date   CREATININE 0.58 06/26/2020   BUN 15 06/26/2020   NA 139 06/26/2020   K 4.2 06/26/2020   CL 103 06/26/2020   CO2 30 06/26/2020   Lab Results  Component Value Date   ALT 19 06/26/2020   AST 15 06/26/2020   ALKPHOS 60 06/26/2020   BILITOT 0.5 06/26/2020   Lab Results  Component Value Date   CHOL 179 06/26/2020   Lab Results  Component Value Date   HDL 47.60 06/26/2020   Lab Results  Component Value Date   LDLCALC 109 (H) 06/26/2020   Lab Results  Component Value Date   TRIG 112.0 06/26/2020   Lab Results  Component Value Date   CHOLHDL 4 06/26/2020   Lab Results  Component Value Date   HGBA1C 6.7 (H) 06/26/2020   ASSESSMENT AND PLAN:   1) DM 2, diet controlled. Hba1c, microalb/cr today. Feet exam normal.  2) HLD: tolerating atorvastatin 80 qd. FLP and hepatic panel today.  3) Health maintenance exam: Reviewed age and gender appropriate health maintenance issues (prudent diet, regular exercise, health risks of tobacco and excessive alcohol, use of seatbelts, fire alarms in home, use of sunscreen).  Also reviewed age and gender appropriate health screening as well as vaccine recommendations. Vaccines: Flu->given today.  Prevnar 20->given today.  Otherwise all utd. Labs: fasting HP, Hba1c, urine microalb/cr. Cervical ca screening: per pt's GYN. Breast ca screening: per pt's GYN. Colon ca screening: recall 2031.  At end of visit she requested ref to allergist for allergic rhinitis and episodes of itchy rash (?urticaria) despite being on zyrtec.  Referral ordered today.  Also describes dyspepsia sx's when eats spicy foods and lemon water. Encouraged her to limit these foods and take  pepcid prn, call if not improving.  An After Visit Summary was printed and given to the patient.  FOLLOW UP:  No follow-ups on file.  Signed:  [name], MD           06/29/2021

## 2021-06-30 ENCOUNTER — Telehealth: Payer: Self-pay

## 2021-06-30 LAB — CBC WITH DIFFERENTIAL/PLATELET
Basophils Absolute: 0 10*3/uL (ref 0.0–0.1)
Basophils Relative: 0.9 % (ref 0.0–3.0)
Eosinophils Absolute: 0.1 10*3/uL (ref 0.0–0.7)
Eosinophils Relative: 2.5 % (ref 0.0–5.0)
HCT: 39.7 % (ref 36.0–46.0)
Hemoglobin: 13.2 g/dL (ref 12.0–15.0)
Lymphocytes Relative: 40.2 % (ref 12.0–46.0)
Lymphs Abs: 2.1 10*3/uL (ref 0.7–4.0)
MCHC: 33.1 g/dL (ref 30.0–36.0)
MCV: 89.7 fl (ref 78.0–100.0)
Monocytes Absolute: 0.3 10*3/uL (ref 0.1–1.0)
Monocytes Relative: 6.5 % (ref 3.0–12.0)
Neutro Abs: 2.6 10*3/uL (ref 1.4–7.7)
Neutrophils Relative %: 49.9 % (ref 43.0–77.0)
Platelets: 282 10*3/uL (ref 150.0–400.0)
RBC: 4.43 Mil/uL (ref 3.87–5.11)
RDW: 13.6 % (ref 11.5–15.5)
WBC: 5.1 10*3/uL (ref 4.0–10.5)

## 2021-06-30 LAB — COMPREHENSIVE METABOLIC PANEL
ALT: 27 U/L (ref 0–35)
AST: 18 U/L (ref 0–37)
Albumin: 4 g/dL (ref 3.5–5.2)
Alkaline Phosphatase: 84 U/L (ref 39–117)
BUN: 9 mg/dL (ref 6–23)
CO2: 26 mEq/L (ref 19–32)
Calcium: 9.4 mg/dL (ref 8.4–10.5)
Chloride: 105 mEq/L (ref 96–112)
Creatinine, Ser: 0.61 mg/dL (ref 0.40–1.20)
GFR: 102.89 mL/min (ref >60.00)
Glucose, Bld: 131 mg/dL — ABNORMAL HIGH (ref 70–99)
Potassium: 4.2 mEq/L (ref 3.5–5.1)
Sodium: 141 mEq/L (ref 135–145)
Total Bilirubin: 0.6 mg/dL (ref 0.2–1.2)
Total Protein: 7.2 g/dL (ref 6.0–8.3)

## 2021-06-30 LAB — LIPID PANEL
Cholesterol: 154 mg/dL (ref 0–200)
HDL: 47.6 mg/dL (ref >39.00)
LDL Cholesterol: 86 mg/dL (ref 0–99)
NonHDL: 106.68
Total CHOL/HDL Ratio: 3
Triglycerides: 105 mg/dL (ref 0.0–149.0)
VLDL: 21 mg/dL (ref 0.0–40.0)

## 2021-06-30 MED ORDER — METFORMIN HCL 500 MG PO TABS: 500.0000 mg | ORAL_TABLET | Freq: Two times a day (BID) | ORAL | 3 refills | Status: DC

## 2021-06-30 NOTE — Telephone Encounter (Signed)
-----   Message from Tiffany Sou, MD sent at 06/30/2021 11:03 AM EDT ----- All labs great except her Hba1c is up to 8.2%.  Goal for diabetes is <7%. I recommend she start metformin 500mg  bid.  Pls do 30d supply with 3 RF's. Needs office f/u appt in 3-4 mo (does not have to fast).

## 2021-06-30 NOTE — Telephone Encounter (Signed)
Spoke with pt regarding labs and instructions.   

## 2021-07-19 ENCOUNTER — Encounter: Payer: Self-pay | Admitting: Family Medicine

## 2021-07-19 NOTE — Telephone Encounter (Signed)
Pls tell pt there are many good options and sometimes it comes down to what med her insurance covers. Have her check with insurer about coverage for any of the following meds and get back with [place]:   Invokana, farxiga, jardiance, victoza, ozempic, mounjaro.

## 2021-07-19 NOTE — Telephone Encounter (Signed)
Please advise 

## 2021-07-26 ENCOUNTER — Telehealth: Payer: Self-pay | Admitting: Family Medicine

## 2021-07-26 NOTE — Telephone Encounter (Signed)
Pt informed waist circurmference, pt signature needed for form completion. She will be here by 4:30 today to complete this

## 2021-07-26 NOTE — Telephone Encounter (Signed)
Pt came in for completion of form, form faxed.

## 2021-07-26 NOTE — Telephone Encounter (Signed)
Pt has dropped off Health Screening Form for Dr. Anitra Lauth to fill out. She would like this form faxed to the # given [phone]. Please call pt when complete at [phone]. I have placed form in Dr. Idelle Leech box upfront at check in.

## 2021-09-17 ENCOUNTER — Telehealth (INDEPENDENT_AMBULATORY_CARE_PROVIDER_SITE_OTHER): Payer: Commercial Managed Care - PPO | Admitting: Family Medicine

## 2021-09-17 ENCOUNTER — Encounter: Payer: Self-pay | Admitting: Family Medicine

## 2021-09-17 ENCOUNTER — Other Ambulatory Visit: Payer: Self-pay

## 2021-09-17 VITALS — Temp 103.0°F | Wt 128.0 lb

## 2021-09-17 DIAGNOSIS — R5081 Fever presenting with conditions classified elsewhere: Secondary | ICD-10-CM | POA: Diagnosis not present

## 2021-09-17 DIAGNOSIS — N3000 Acute cystitis without hematuria: Secondary | ICD-10-CM | POA: Diagnosis not present

## 2021-09-17 DIAGNOSIS — J111 Influenza due to unidentified influenza virus with other respiratory manifestations: Secondary | ICD-10-CM

## 2021-09-17 MED ORDER — PROMETHAZINE HCL 12.5 MG PO TABS: ORAL_TABLET | ORAL | 0 refills | Status: DC

## 2021-09-17 MED ORDER — SULFAMETHOXAZOLE-TRIMETHOPRIM 800-160 MG PO TABS: 1.0000 | ORAL_TABLET | Freq: Two times a day (BID) | ORAL | 0 refills | Status: AC

## 2021-09-17 MED ORDER — HYDROCODONE BIT-HOMATROP MBR 5-1.5 MG/5ML PO SOLN: ORAL | 0 refills | Status: DC

## 2021-09-17 NOTE — Progress Notes (Signed)
Virtual Visit via Video Note  I connected with Tiffany Hicks on 09/17/21 at 10:30 AM EST by a video enabled telemedicine application and verified that I am speaking with the correct person using two identifiers.  Location patient: home, [place] Location provider:work or home office Persons participating in the virtual visit: patient, provider  I discussed the limitations of evaluation and management by telemedicine and the availability of in person appointments. The patient expressed understanding and agreed to proceed.  HPI: 50 y/o female being seen today for fever. Onset 3-4 d/a: fever/chills, fatigue, achy, nasal cong/runny nose, HA, cough. Stomach hurts, particularly with trying to take an ibuprofen or Tylenol.  She is able to drink fluids well.  Not wanting to eat much at all.  She threw up last night.  No diarrhea.  No shortness of breath.  The headache hurts worse when she coughs. Of note she started to get burning with urination, urinary urgency, and urinary frequency prior to the onset of recurrent respiratory illness.  Home covid test--Negative at the onset of this illness. No known recent sick contacts.  ROS: See pertinent positives and negatives per HPI.  Past Medical History:  Diagnosis Date   Allergic rhinitis    Allergy testing approx 2012 and 2019.   Diabetes mellitus ([place]) 05/2018   Hba1c 6.5%   Diverticulosis 06/2021   colonoscopy   Food allergy    cucumber, mushroom, orange, sesame seed   Gestational diabetes    Both pregnancies.   Hyperlipemia, mixed 05/2018   Great response to statin   Left carpal tunnel syndrome    Migraines    Multiple thyroid nodules    FNA of one nodule: benign follic nodule/cyst on 06/14/08.  f/u u/s 08/2019 stable nodules, no criteria for bx.  Rpt 01/2021->2 new nodules, need f/u u/s 1 yr.  Annual repeat rec'd until stable for 5 yrs (Dr. Radene Knee).   Ureterolithiasis    R Ureteroscopy with holmium laser 08/05/19. CaOx.    Past Surgical History:   Procedure Laterality Date   BIOPSY THYROID Right 11/15/2017   benign.     CESAREAN SECTION  08/03/2011   Procedure: CESAREAN SECTION;  Surgeon: Margarette Asal;  Location: [facility];  Service: Gynecology;  Laterality: N/A;   COLONOSCOPY  06/29/2020   no adenomas. Recall 2031   CYSTOSCOPY WITH RETROGRADE PYELOGRAM, URETEROSCOPY AND STENT PLACEMENT Right 08/05/2019   Procedure: CYSTOSCOPY WITH RETROGRADE PYELOGRAM, URETEROSCOPY AND STENT PLACEMENTwith laser;  Surgeon: Franchot Gallo, MD;  Location: [facility];  Service: Urology;  Laterality: Right;  1 HR   HOLMIUM LASER APPLICATION Right 32/67/1245   Procedure: HOLMIUM LASER APPLICATION;  Surgeon: Franchot Gallo, MD;  Location: [facility];  Service: Urology;  Laterality: Right;   WISDOM TOOTH EXTRACTION  2000     Current Outpatient Medications:    atorvastatin (LIPITOR) 80 MG tablet, Take 1 tablet (80 mg total) by mouth daily., Disp: 90 tablet, Rfl: 3   ezetimibe (ZETIA) 10 MG tablet, Take 1 tablet (10 mg total) by mouth daily., Disp: 30 tablet, Rfl: 3   metFORMIN (GLUCOPHAGE) 500 MG tablet, Take 1 tablet (500 mg total) by mouth 2 (two) times daily with a meal., Disp: 60 tablet, Rfl: 3   azelastine (ASTELIN) 0.1 % nasal spray, azelastine 137 mcg (0.1 %) nasal spray aerosol  USE TWO SPRAYS BY NASAL ROUTE TWICE DAILY (Patient not taking: Reported on 06/29/2021), Disp: , Rfl:    cetirizine (ZYRTEC) 10 MG tablet, Take 10 mg by mouth daily. (Patient not taking:  Reported on 06/29/2021), Disp: , Rfl:    fluticasone (FLONASE) 50 MCG/ACT nasal spray, fluticasone propionate 50 mcg/actuation nasal spray,suspension  USE 2 SPRAYS IN EACH NOSTRIL ONCE DAILY (Patient not taking: Reported on 06/29/2021), Disp: , Rfl:    levocetirizine (XYZAL) 5 MG tablet, levocetirizine 5 mg tablet  TAKE 1 TABLET BY MOUTH EVERY EVENING (Patient not taking: Reported on 06/29/2021), Disp: , Rfl:   EXAM:  VITALS per patient if applicable:  Vitals with BMI 09/17/2021 06/29/2021  06/29/2020  Height - 5\' 0"  -  Weight 128 lbs 146 lbs 13 oz -  BMI - 76.73 -  Systolic - 419 379  Diastolic - 74 86  Pulse - 73 62  Temp 103 today   GENERAL: alert, oriented, appears well and in no acute distress  HEENT: atraumatic, conjunttiva clear, no obvious abnormalities on inspection of external nose and ears  NECK: normal movements of the head and neck  LUNGS: on inspection no signs of respiratory distress, breathing rate appears normal, no obvious gross SOB, gasping or wheezing  CV: no obvious cyanosis  MS: moves all visible extremities without noticeable abnormality  PSYCH/NEURO: pleasant and cooperative, no obvious depression or anxiety, speech and thought processing grossly intact  LABS: none today    Chemistry      Component Value Date/Time   NA 141 06/29/2021 0839   NA 139 03/27/2017 0000   K 4.2 06/29/2021 0839   CL 105 06/29/2021 0839   CO2 26 06/29/2021 0839   BUN 9 06/29/2021 0839   BUN 11 03/27/2017 0000   CREATININE 0.61 06/29/2021 0839   GLU 102 05/04/2018 0000   GLU 102 05/04/2018 0000      Component Value Date/Time   CALCIUM 9.4 06/29/2021 0839   ALKPHOS 84 06/29/2021 0839   AST 18 06/29/2021 0839   ALT 27 06/29/2021 0839   BILITOT 0.6 06/29/2021 0839     Lab Results  Component Value Date   HGBA1C 8.2 (H) 06/29/2021   ASSESSMENT AND PLAN:  Discussed the following assessment and plan:  1.Influenza-like illness.  She is out of the treatment window for antivirals.   Home COVID test negative. Most prominent symptoms are cough, nausea, and headache. Encouraged ongoing focus on good fluid intake. She will try Aleve for 440 for fever.  Promethazine 12.5 mg prescribed today. Additionally, Hycodan syrup, 1 to 2 teaspoons 3 times daily as needed, 120 mils.  #2 UTI. Bactrim double strength, 1 tab twice daily x5 days.   I discussed the assessment and treatment plan with the patient. The patient was provided an opportunity to ask questions and  all were answered. The patient agreed with the plan and demonstrated an understanding of the instructions.   F/u: If not significantly improving in 2 to 3 days.  Signed:  [name], MD           09/17/2021

## 2021-09-20 ENCOUNTER — Encounter: Payer: Self-pay | Admitting: Family Medicine

## 2021-09-20 NOTE — Telephone Encounter (Signed)
OK. Pls eRx famotidine 20mg , 1 tab po bid, #60, RF x 1 (for reflux). I recommend otc senakot S (store brand/generic is fine), 2 tabs po every evening---for constipation.

## 2021-09-21 MED ORDER — FAMOTIDINE 20 MG PO TABS: 20.0000 mg | ORAL_TABLET | Freq: Two times a day (BID) | ORAL | 1 refills | Status: DC

## 2021-09-21 NOTE — Telephone Encounter (Signed)
MyChart message read. Nothing further needed at this time

## 2021-09-24 ENCOUNTER — Ambulatory Visit: Payer: Commercial Managed Care - PPO | Admitting: Family Medicine

## 2021-10-15 ENCOUNTER — Encounter: Payer: Self-pay | Admitting: Family Medicine

## 2021-10-15 ENCOUNTER — Ambulatory Visit (INDEPENDENT_AMBULATORY_CARE_PROVIDER_SITE_OTHER): Payer: Commercial Managed Care - PPO | Admitting: Family Medicine

## 2021-10-15 ENCOUNTER — Other Ambulatory Visit: Payer: Self-pay

## 2021-10-15 VITALS — BP 107/74 | HR 84 | Temp 98.1°F | Ht 60.0 in | Wt 122.4 lb

## 2021-10-15 DIAGNOSIS — R5383 Other fatigue: Secondary | ICD-10-CM

## 2021-10-15 DIAGNOSIS — E119 Type 2 diabetes mellitus without complications: Secondary | ICD-10-CM

## 2021-10-15 DIAGNOSIS — R1013 Epigastric pain: Secondary | ICD-10-CM

## 2021-10-15 DIAGNOSIS — R6889 Other general symptoms and signs: Secondary | ICD-10-CM

## 2021-10-15 DIAGNOSIS — R300 Dysuria: Secondary | ICD-10-CM

## 2021-10-15 DIAGNOSIS — R310 Gross hematuria: Secondary | ICD-10-CM

## 2021-10-15 DIAGNOSIS — R634 Abnormal weight loss: Secondary | ICD-10-CM

## 2021-10-15 DIAGNOSIS — T50905A Adverse effect of unspecified drugs, medicaments and biological substances, initial encounter: Secondary | ICD-10-CM

## 2021-10-15 LAB — POCT URINALYSIS DIPSTICK
Bilirubin, UA: NEGATIVE
Glucose, UA: NEGATIVE
Ketones, UA: NEGATIVE
Nitrite, UA: NEGATIVE
Protein, UA: POSITIVE — AB
Spec Grav, UA: 1.015 (ref 1.010–1.025)
Urobilinogen, UA: 0.2 E.U./dL
pH, UA: 5 (ref 5.0–8.0)

## 2021-10-15 LAB — CBC WITH DIFFERENTIAL/PLATELET
Basophils Absolute: 0 10*3/uL (ref 0.0–0.1)
Basophils Relative: 0.7 % (ref 0.0–3.0)
Eosinophils Absolute: 0.1 10*3/uL (ref 0.0–0.7)
Eosinophils Relative: 1.3 % (ref 0.0–5.0)
HCT: 36.3 % (ref 36.0–46.0)
Hemoglobin: 12 g/dL (ref 12.0–15.0)
Lymphocytes Relative: 28.9 % (ref 12.0–46.0)
Lymphs Abs: 1.8 10*3/uL (ref 0.7–4.0)
MCHC: 33 g/dL (ref 30.0–36.0)
MCV: 89.4 fl (ref 78.0–100.0)
Monocytes Absolute: 0.5 10*3/uL (ref 0.1–1.0)
Monocytes Relative: 8.2 % (ref 3.0–12.0)
Neutro Abs: 3.8 10*3/uL (ref 1.4–7.7)
Neutrophils Relative %: 60.9 % (ref 43.0–77.0)
Platelets: 376 10*3/uL (ref 150.0–400.0)
RBC: 4.06 Mil/uL (ref 3.87–5.11)
RDW: 14.7 % (ref 11.5–15.5)
WBC: 6.3 10*3/uL (ref 4.0–10.5)

## 2021-10-15 LAB — VITAMIN B12: Vitamin B-12: 473 pg/mL (ref 211–911)

## 2021-10-15 LAB — COMPREHENSIVE METABOLIC PANEL
ALT: 17 U/L (ref 0–35)
AST: 15 U/L (ref 0–37)
Albumin: 3.9 g/dL (ref 3.5–5.2)
Alkaline Phosphatase: 83 U/L (ref 39–117)
BUN: 14 mg/dL (ref 6–23)
CO2: 25 mEq/L (ref 19–32)
Calcium: 9.5 mg/dL (ref 8.4–10.5)
Chloride: 102 mEq/L (ref 96–112)
Creatinine, Ser: 0.6 mg/dL (ref 0.40–1.20)
GFR: 103.09 mL/min (ref >60.00)
Glucose, Bld: 88 mg/dL (ref 70–99)
Potassium: 4.2 mEq/L (ref 3.5–5.1)
Sodium: 139 mEq/L (ref 135–145)
Total Bilirubin: 0.6 mg/dL (ref 0.2–1.2)
Total Protein: 7.5 g/dL (ref 6.0–8.3)

## 2021-10-15 LAB — T4, FREE: Free T4: 1.03 ng/dL (ref 0.60–1.60)

## 2021-10-15 LAB — HEMOGLOBIN A1C: Hgb A1c MFr Bld: 6.7 % — ABNORMAL HIGH (ref 4.6–6.5)

## 2021-10-15 LAB — LIPASE: Lipase: 34 U/L (ref 11.0–59.0)

## 2021-10-15 LAB — TSH: TSH: 0.52 u[IU]/mL (ref 0.35–5.50)

## 2021-10-15 MED ORDER — CIPROFLOXACIN HCL 500 MG PO TABS: 500.0000 mg | ORAL_TABLET | Freq: Two times a day (BID) | ORAL | 0 refills | Status: AC

## 2021-10-15 MED ORDER — PANTOPRAZOLE SODIUM 40 MG PO TBEC: 40.0000 mg | DELAYED_RELEASE_TABLET | Freq: Every day | ORAL | 0 refills | Status: DC

## 2021-10-15 NOTE — Progress Notes (Signed)
OFFICE VISIT  10/17/2021  CC:  Chief Complaint  Patient presents with   Fatigue    Pt also c/o weakness, constantly cold since after Thanksgiving. Last month she struggled to get out of bed.    HPI:    Patient is a 51 y.o. female with DM 2, HLD, and multiple thyroid nodules (euthyroid) who presents for "weakness and fatigue".  HPI: Erleen started feeling significant fatigue when she started getting an influenza-like illness about a month ago.  I saw her at that time--09/17/2021, she also had UTI symptoms at that time.  I treated her with 3 days of Bactrim.  Home covid test was neg at that time. Her respiratory symptoms resolved but her fatigue has been ongoing.  Also having cold intolerance, question of night sweats, and has had approximately 10 pound weight loss through this time.  Has epigastric region pain/discomfort regularly.  GERD is well.  Takes Pepcid over-the-counter but not regularly. She does recall 1 episode of gross hematuria sometime over the last month.  She has continued to have pretty regular feeling of burning with urination.  Of note, at her last follow-up 3 months ago she was doing well but A1c was up to 8.2%, and I started her on metformin at that time.  She notes significant gastric upset since starting metformin and says this initially resulted about 10 pounds of weight loss.  In total she has lost 22 pounds since last weight check here 3 months ago.  ROS as above, plus--> no fevers, no CP, no SOB, no wheezing, no cough, no dizziness, no HAs, no rashes, no melena/hematochezia.  No polyuria or polydipsia.  No myalgias or arthralgias.  No focal weakness, paresthesias, or tremors.  No acute vision or hearing abnormalities.   No recent changes in lower legs. No persistent prob with diarrhea or constipation.  No vomiting. No palpitations.     Past Medical History:  Diagnosis Date   Allergic rhinitis    Allergy testing approx 2012 and 2019.   Diabetes mellitus ([place])  05/2018   Hba1c 6.5%   Diverticulosis 06/2021   colonoscopy   Food allergy    cucumber, mushroom, orange, sesame seed   Gestational diabetes    Both pregnancies.   Hyperlipemia, mixed 05/2018   Great response to statin   Left carpal tunnel syndrome    Migraines    Multiple thyroid nodules    FNA of one nodule: benign follic nodule/cyst on 10/14/95.  f/u u/s 08/2019 stable nodules, no criteria for bx.  Rpt 01/2021->2 new nodules, need f/u u/s 1 yr.  Annual repeat rec'd until stable for 5 yrs (Dr. Radene Knee).   Ureterolithiasis    R Ureteroscopy with holmium laser 08/05/19. CaOx.    Past Surgical History:  Procedure Laterality Date   BIOPSY THYROID Right 11/15/2017   benign.     CESAREAN SECTION  08/03/2011   Procedure: CESAREAN SECTION;  Surgeon: Margarette Asal;  Location: [facility];  Service: Gynecology;  Laterality: N/A;   COLONOSCOPY  06/29/2020   no adenomas. Recall 2031   CYSTOSCOPY WITH RETROGRADE PYELOGRAM, URETEROSCOPY AND STENT PLACEMENT Right 08/05/2019   Procedure: CYSTOSCOPY WITH RETROGRADE PYELOGRAM, URETEROSCOPY AND STENT PLACEMENTwith laser;  Surgeon: Franchot Gallo, MD;  Location: [facility];  Service: Urology;  Laterality: Right;  1 HR   HOLMIUM LASER APPLICATION Right 02/63/7858   Procedure: HOLMIUM LASER APPLICATION;  Surgeon: Franchot Gallo, MD;  Location: [facility];  Service: Urology;  Laterality: Right;   WISDOM TOOTH EXTRACTION  2000    Outpatient Medications Prior to Visit  Medication Sig Dispense Refill   atorvastatin (LIPITOR) 80 MG tablet Take 1 tablet (80 mg total) by mouth daily. 90 tablet 3   metFORMIN (GLUCOPHAGE) 500 MG tablet Take 1 tablet (500 mg total) by mouth 2 (two) times daily with a meal. 60 tablet 3   azelastine (ASTELIN) 0.1 % nasal spray azelastine 137 mcg (0.1 %) nasal spray aerosol  USE TWO SPRAYS BY NASAL ROUTE TWICE DAILY (Patient not taking: Reported on 06/29/2021)     ezetimibe (ZETIA) 10 MG tablet Take 1 tablet (10 mg total) by mouth  daily. (Patient not taking: Reported on 10/15/2021) 30 tablet 3   famotidine (PEPCID) 20 MG tablet Take 1 tablet (20 mg total) by mouth 2 (two) times daily. (Patient not taking: Reported on 10/15/2021) 60 tablet 1   levocetirizine (XYZAL) 5 MG tablet levocetirizine 5 mg tablet  TAKE 1 TABLET BY MOUTH EVERY EVENING (Patient not taking: Reported on 06/29/2021)     promethazine (PHENERGAN) 12.5 MG tablet 1-2 tabs po q6h prn (Patient not taking: Reported on 10/15/2021) 20 tablet 0   cetirizine (ZYRTEC) 10 MG tablet Take 10 mg by mouth daily. (Patient not taking: Reported on 10/15/2021)     fluticasone (FLONASE) 50 MCG/ACT nasal spray fluticasone propionate 50 mcg/actuation nasal spray,suspension  USE 2 SPRAYS IN EACH NOSTRIL ONCE DAILY (Patient not taking: Reported on 06/29/2021)     HYDROcodone bit-homatropine (HYCODAN) 5-1.5 MG/5ML syrup 1-2 tsp po tid prn cough (Patient not taking: Reported on 10/15/2021) 120 mL 0   No facility-administered medications prior to visit.    Allergies  Allergen Reactions   Cucumber Extract Rash   Mushroom Extract Complex Rash   Peanut-Containing Drug Products Rash   Soy Allergy Rash    ROS As per HPI  PE: Vitals with BMI 10/15/2021 09/17/2021 06/29/2021  Height 5\' 0"  - 5\' 0"   Weight 122 lbs 6 oz 128 lbs 146 lbs 13 oz  BMI 16.1 - 09.60  Systolic 454 - 098  Diastolic 74 - 74  Pulse 84 - 73     Physical Exam  Exam chaperoned by Shepard General, CMA Gen: Alert, well appearing.  Patient is oriented to person, place, time, and situation. AFFECT: pleasant, lucid thought and speech. JXB:JYNW: no injection, icteris, swelling, or exudate.  EOMI, PERRLA. Mouth: lips without lesion/swelling.  Oral mucosa pink and moist. Oropharynx without erythema, exudate, or swelling.  Neck: supple, no LAD, thyromegaly/nodule or tenderness. CV: RRR, no m/r/g.   LUNGS: CTA bilat, nonlabored resps, good aeration in all lung fields. ABD: soft, mild TTP in mid-epig and midline lower abd. no  guarding or rebound.  No distention.  Bowel sounds normal.  No hepatosplenomegaly or mass.  No bruit. EXT: no clubbing or cyanosis.  no edema.  Skin: No pallor or jaundice.  LABS:  Last CBC Lab Results  Component Value Date   WBC 6.3 10/15/2021   HGB 12.0 10/15/2021   HCT 36.3 10/15/2021   MCV 89.4 10/15/2021   MCH 29.5 08/02/2019   RDW 14.7 10/15/2021   PLT 376.0 29/56/2130   Last metabolic panel Lab Results  Component Value Date   GLUCOSE 88 10/15/2021   NA 139 10/15/2021   K 4.2 10/15/2021   CL 102 10/15/2021   CO2 25 10/15/2021   BUN 14 10/15/2021   CREATININE 0.60 10/15/2021   GFRNONAA >60 08/02/2019   CALCIUM 9.5 10/15/2021   PROT 7.5 10/15/2021   ALBUMIN 3.9 10/15/2021  BILITOT 0.6 10/15/2021   ALKPHOS 83 10/15/2021   AST 15 10/15/2021   ALT 17 10/15/2021   ANIONGAP 9 08/02/2019   Last hemoglobin A1c Lab Results  Component Value Date   HGBA1C 6.7 (H) 10/15/2021   Last thyroid functions Lab Results  Component Value Date   TSH 0.52 10/15/2021   T3TOTAL 100 10/15/2021    IMPRESSION AND PLAN:  #1 fatigue.  Seems most consistent with postviral fatigue.   CBC,cmet, thyroid panel today.  2. UTI, with episode of gross hematuria. Sx's seemingly did not clear up with bactrim 1 mo ago. UA abnl today, cipro x 3d rx'd, sent for micro + c/s. Will need to make sure her hematuria completely clears with resolution of UTI sx's, otherwise may warrant hematuria w/u.  3) Dyspepsia/gastritis, GERD.  Has affected PO intake significantly--has lost 23 lbs in the last 34mo. Will start daily pantoprazole 40mg  and she'll continue otc pepcid qhs. If not much improved at f/u in 10-14d then will refer to GI---? H pylori. At least some of her sx's are from her metformin.  D/c metformin today.  4) DM 2. Intol metformin. Nutritionist referral.   Hba1c today.  An After Visit Summary was printed and given to the patient.  FOLLOW UP: Return for 10-14d f/u GI/fatigue/wt  loss.  Signed:  [name], MD           10/17/2021

## 2021-10-15 NOTE — Patient Instructions (Signed)
Stop metformin. Start pantoprazole (prescription sent) every morning, preferably before eating breakfast. Take pepcid every evening.

## 2021-10-16 LAB — T3: T3, Total: 100 ng/dL (ref 76–181)

## 2021-10-18 LAB — CULTURE INDICATED

## 2021-10-18 LAB — URINALYSIS W MICROSCOPIC + REFLEX CULTURE
Bilirubin Urine: NEGATIVE
Glucose, UA: NEGATIVE
Hgb urine dipstick: NEGATIVE
Hyaline Cast: NONE SEEN /LPF
Ketones, ur: NEGATIVE
Nitrites, Initial: POSITIVE — AB
Protein, ur: NEGATIVE
RBC / HPF: NONE SEEN /HPF (ref 0–2)
Specific Gravity, Urine: 1.015 (ref 1.001–1.035)
Squamous Epithelial / HPF: NONE SEEN /HPF (ref <=5)
pH: 6 (ref 5.0–8.0)

## 2021-10-18 LAB — URINE CULTURE
MICRO NUMBER:: 12840771
SPECIMEN QUALITY:: ADEQUATE

## 2021-10-19 MED ORDER — CIPROFLOXACIN HCL 500 MG PO TABS: 500.0000 mg | ORAL_TABLET | Freq: Two times a day (BID) | ORAL | 0 refills | Status: AC

## 2021-10-19 NOTE — Addendum Note (Signed)
Addended by: Octaviano Glow on: 10/19/2021 11:37 AM   Modules accepted: Orders

## 2021-10-25 ENCOUNTER — Telehealth: Payer: Self-pay

## 2021-10-25 ENCOUNTER — Encounter: Payer: Self-pay | Admitting: Family Medicine

## 2021-10-25 ENCOUNTER — Ambulatory Visit: Payer: Commercial Managed Care - PPO | Admitting: Family Medicine

## 2021-10-25 ENCOUNTER — Other Ambulatory Visit: Payer: Self-pay

## 2021-10-25 VITALS — BP 104/71 | HR 79 | Temp 97.6°F | Ht 60.0 in | Wt 125.4 lb

## 2021-10-25 DIAGNOSIS — T50905D Adverse effect of unspecified drugs, medicaments and biological substances, subsequent encounter: Secondary | ICD-10-CM

## 2021-10-25 DIAGNOSIS — R1013 Epigastric pain: Secondary | ICD-10-CM

## 2021-10-25 DIAGNOSIS — N3001 Acute cystitis with hematuria: Secondary | ICD-10-CM

## 2021-10-25 DIAGNOSIS — R310 Gross hematuria: Secondary | ICD-10-CM

## 2021-10-25 DIAGNOSIS — R634 Abnormal weight loss: Secondary | ICD-10-CM

## 2021-10-25 DIAGNOSIS — E119 Type 2 diabetes mellitus without complications: Secondary | ICD-10-CM

## 2021-10-25 LAB — POCT URINALYSIS DIPSTICK
Bilirubin, UA: NEGATIVE
Blood, UA: NEGATIVE
Glucose, UA: NEGATIVE
Ketones, UA: NEGATIVE
Leukocytes, UA: NEGATIVE
Nitrite, UA: NEGATIVE
Protein, UA: NEGATIVE
Spec Grav, UA: <=1.005 — AB (ref 1.010–1.025)
Urobilinogen, UA: 0.2 E.U./dL
pH, UA: 6 (ref 5.0–8.0)

## 2021-10-25 NOTE — Telephone Encounter (Signed)
Please see message below. LM for pt to inform of referral update  Type Date User Summary Attachment  General 10/18/2021  8:39 AM Cyndi Bender     Note   Unable to leave msg on 10/18/21 - caller answered and disconnected. KB      [2:29 PM] Ricardo Jericho I ordered referral to nutritionist 10 d/a for [employer].  She says she never got contacted by them.  Pls check on this. thx

## 2021-10-25 NOTE — Progress Notes (Signed)
OFFICE VISIT  10/25/2021  CC:  Chief Complaint  Patient presents with   Follow-up    GI, fatigue, weight loss    HPI:    Patient is a 51 y.o. female who presents accompanied by her husband todayt for 10 day f/u fatigue, dyspepsia, and UTI. A/P as of last visit: "#1 fatigue.  Seems most consistent with postviral fatigue.   CBC,cmet, thyroid panel today.   2. UTI, with episode of gross hematuria. Sx's seemingly did not clear up with bactrim 1 mo ago. UA abnl today, cipro x 3d rx'd, sent for micro + c/s. Will need to make sure her hematuria completely clears with resolution of UTI sx's, otherwise may warrant hematuria w/u.   3) Dyspepsia/gastritis, GERD.  Has affected PO intake significantly--has lost 23 lbs in the last 50mo. Will start daily pantoprazole 40mg  and she'll continue otc pepcid qhs. If not much improved at f/u in 10-14d then will refer to GI---? H pylori. At least some of her sx's are from her metformin.  D/c metformin today.   4) DM 2. Intol metformin. Nutritionist referral.   Hba1c today."  INTERIM HX: She feels significantly improved. Energy level better, appetite returning.  She feels like getting off the metformin helped a lot.  She has also taken the daily Protonix and Pepcid.  Wt is up 3 lbs on our scale compared to visit 10d ago.  Her urine did grow E. coli.  She was still having some symptoms we will give a call back about results.  I did another round of Cipro.  She does feel like this is better and just has a little bit of burning with urination.  We reviewed her recent labs extensively today.  ROS --> no fevers, no CP, no SOB, no wheezing, no cough, no dizziness, no HAs, no rashes, no melena/hematochezia.  No polyuria or polydipsia.  No myalgias or arthralgias.  No focal weakness, paresthesias, or tremors.  No acute vision or hearing abnormalities.  No dysuria or unusual/new urinary urgency or frequency.  No recent changes in lower legs. No n/v/d or abd  pain.  No palpitations.     Past Medical History:  Diagnosis Date   Allergic rhinitis    Allergy testing approx 2012 and 2019.   Diabetes mellitus ([place]) 05/2018   Hba1c 6.5%   Diverticulosis 06/2021   colonoscopy   Food allergy    cucumber, mushroom, orange, sesame seed   Gestational diabetes    Both pregnancies.   Hyperlipemia, mixed 05/2018   Great response to statin   Left carpal tunnel syndrome    Migraines    Multiple thyroid nodules    FNA of one nodule: benign follic nodule/cyst on 12/13/98.  f/u u/s 08/2019 stable nodules, no criteria for bx.  Rpt 01/2021->2 new nodules, need f/u u/s 1 yr.  Annual repeat rec'd until stable for 5 yrs (Dr. Radene Knee).   Ureterolithiasis    R Ureteroscopy with holmium laser 08/05/19. CaOx.    Past Surgical History:  Procedure Laterality Date   BIOPSY THYROID Right 11/15/2017   benign.     CESAREAN SECTION  08/03/2011   Procedure: CESAREAN SECTION;  Surgeon: Margarette Asal;  Location: [facility];  Service: Gynecology;  Laterality: N/A;   COLONOSCOPY  06/29/2020   no adenomas. Recall 2031   CYSTOSCOPY WITH RETROGRADE PYELOGRAM, URETEROSCOPY AND STENT PLACEMENT Right 08/05/2019   Procedure: CYSTOSCOPY WITH RETROGRADE PYELOGRAM, URETEROSCOPY AND STENT PLACEMENTwith laser;  Surgeon: Franchot Gallo, MD;  Location: [facility];  Service: Urology;  Laterality: Right;  1 HR   HOLMIUM LASER APPLICATION Right 78/29/5621   Procedure: HOLMIUM LASER APPLICATION;  Surgeon: Franchot Gallo, MD;  Location: [facility];  Service: Urology;  Laterality: Right;   [place] EXTRACTION  2000    Outpatient Medications Prior to Visit  Medication Sig Dispense Refill   atorvastatin (LIPITOR) 80 MG tablet Take 1 tablet (80 mg total) by mouth daily. 90 tablet 3   pantoprazole (PROTONIX) 40 MG tablet Take 1 tablet (40 mg total) by mouth daily. 30 tablet 0   azelastine (ASTELIN) 0.1 % nasal spray azelastine 137 mcg (0.1 %) nasal spray aerosol  USE TWO SPRAYS BY NASAL  ROUTE TWICE DAILY (Patient not taking: Reported on 06/29/2021)     ezetimibe (ZETIA) 10 MG tablet Take 1 tablet (10 mg total) by mouth daily. (Patient not taking: Reported on 10/15/2021) 30 tablet 3   famotidine (PEPCID) 20 MG tablet Take 1 tablet (20 mg total) by mouth 2 (two) times daily. (Patient not taking: Reported on 10/15/2021) 60 tablet 1   levocetirizine (XYZAL) 5 MG tablet levocetirizine 5 mg tablet  TAKE 1 TABLET BY MOUTH EVERY EVENING (Patient not taking: Reported on 06/29/2021)     promethazine (PHENERGAN) 12.5 MG tablet 1-2 tabs po q6h prn (Patient not taking: Reported on 10/15/2021) 20 tablet 0   No facility-administered medications prior to visit.    Allergies  Allergen Reactions   Cucumber Extract Rash   Mushroom Extract Complex Rash   Peanut-Containing Drug Products Rash   Soy Allergy Rash    ROS As per HPI  PE: Vitals with BMI 10/25/2021 10/15/2021 09/17/2021  Height 5\' 0"  5\' 0"  -  Weight 125 lbs 6 oz 122 lbs 6 oz 128 lbs  BMI 30.86 57.8 -  Systolic 469 629 -  Diastolic 71 74 -  Pulse 79 84 -     Physical Exam  Gen: Alert, well appearing.  Patient is oriented to person, place, time, and situation. AFFECT: pleasant, lucid thought and speech. No further exam today.  LABS:  Last CBC Lab Results  Component Value Date   WBC 6.3 10/15/2021   HGB 12.0 10/15/2021   HCT 36.3 10/15/2021   MCV 89.4 10/15/2021   MCH 29.5 08/02/2019   RDW 14.7 10/15/2021   PLT 376.0 52/84/1324   Last metabolic panel Lab Results  Component Value Date   GLUCOSE 88 10/15/2021   NA 139 10/15/2021   K 4.2 10/15/2021   CL 102 10/15/2021   CO2 25 10/15/2021   BUN 14 10/15/2021   CREATININE 0.60 10/15/2021   GFRNONAA >60 08/02/2019   CALCIUM 9.5 10/15/2021   PROT 7.5 10/15/2021   ALBUMIN 3.9 10/15/2021   BILITOT 0.6 10/15/2021   ALKPHOS 83 10/15/2021   AST 15 10/15/2021   ALT 17 10/15/2021   ANIONGAP 9 08/02/2019   Last hemoglobin A1c Lab Results  Component Value Date    HGBA1C 6.7 (H) 10/15/2021   Last thyroid functions Lab Results  Component Value Date   TSH 0.52 10/15/2021   T3TOTAL 100 10/15/2021   Last vitamin B12 and Folate Lab Results  Component Value Date   VITAMINB12 473 10/15/2021   Lab Results  Component Value Date   LIPASE 34.0 10/15/2021   POC dipstick UA today: NORMAL  IMPRESSION AND PLAN:  #1 dyspepsia/gastritis.  Suspect a lot of the symptoms she was having were side effects of metformin. Continue daily pantoprazole and Pepcid for the next couple of weeks and then  stop. Some weight loss have been occurring but she is up 3 pounds over the last 10 days. Extensive blood work last visit all normal. She noted some concern today that her platelets rose from 282K to 376K. We discussed the fact that she has had some normal fluctuations in platelet levels that are all within the normal level.  I think is reasonable to repeat this test at next follow-up in 3 months.  #2 fatigue.  Suspect postviral plus some contribution from #1 above. As stated above, all labs normal. Reassured.  3.  UTI--- E. Coli.  History of gross hematuria episode. Urine appears normal today and symptoms are clearing with recent 8-day treatment with Cipro.  #4 family history of lung cancer. Her mother was diagnosed with lung cancer.  Her mother was not a smoker. Patient concerned and wonders if there is a screening test available. I told her it was not sure whether chest CT scan would be appropriate screening test in this situation. I said it would be best to get the opinion of an oncologist or medical geneticist.  Offered referral.  She chose to hold off at this time.  #5 diabetes type 2. Intolerant of metformin. Hemoglobin A1c last visit was 6.7%.  Next A1c due around 01/13/2022. No medications at this time.  We will check on the nutritionist referral that we made last visit. She will continue to check glucose once a day  An After Visit Summary was printed and  given to the patient.  FOLLOW UP: No follow-ups on file.  Signed:  [name], MD           10/25/2021

## 2021-10-28 NOTE — Telephone Encounter (Signed)
Pt is scheduled for 3/10 with Nutritionist, confirmed with pt

## 2021-11-02 ENCOUNTER — Encounter: Payer: Self-pay | Admitting: Family Medicine

## 2021-11-22 ENCOUNTER — Other Ambulatory Visit: Payer: Self-pay | Admitting: Urology

## 2021-11-23 ENCOUNTER — Encounter (HOSPITAL_BASED_OUTPATIENT_CLINIC_OR_DEPARTMENT_OTHER): Payer: Self-pay | Admitting: Urology

## 2021-11-23 NOTE — Progress Notes (Signed)
RN talked with patient. History and medications reviewed. Arrival time given (0600) and informed patient that she can have clear fluids until 0400. Ride is secured; Husband.

## 2021-11-24 ENCOUNTER — Other Ambulatory Visit: Payer: Self-pay | Admitting: Urology

## 2021-11-24 NOTE — H&P (Signed)
CC/HPI: 10/25/2021: Returns today for acute evaluation. Treated with Cipro 500 mg twice daily completing yesterday by primary care for symptoms of UTI. Culture was positive for E. coli, resistant to penicillins, gentamicin, Bactrim.    Symptoms began about a month ago with complaints of increased upper respiratory tract symptoms as well as increased fatigue. COVID testing was negative. She was empirically treated with Bactrim for about a week but symptoms did not improve. Also complaining of midline abdominal/epigastric pain and discomfort. She is also been having intermittent right lower back and flank pain/discomfort. She does describe increased urinary frequency, urgency and urge incontinence. Nocturia 3-4 times nightly. Also associated with some intermittent dysuria. Prior to most recent infection treatment, she did have an incidence of gross hematuria x1. Today, since completing Cipro her abdominal pain and discomfort has improved. She has not had any dysuria over the past 24 hours. She does continue to describe frequency/urgency, occasional urge incontinence and nocturia 3-4 times nightly. UA here today is clear. No palpable tenderness on physical exam.   11/08/2021: Here today for f/u exam with imaging. KUB last visit was somewhat inconclusive, there was some concerns of the patient having a possible distal right ureteral calculus. I started her on tamsulosin. Today denies interval stone material passage. She continues to have dull aching sensation over the right lower back as well as continued dysuria and increased frequency/urgency from baseline. She denies any gross hematuria. No correlating fevers or chills, nausea/vomiting. Pain has not been severe enough to warrant use of any stronger pain medicine outside of Tylenol or ibuprofen. Hydronephrosis present on today's renal ultrasound, previously identified 4 mm calculus within the right kidney is no longer visible. On KUB there is a 4.4 mm opacity  consistent with a distal right ureteral stone identified lateral to the right sacral wing. UA without microscopic abnormalities.     ALLERGIES: None    MEDICATIONS: Tamsulosin Hcl 0.4 mg capsule 1 capsule PO Daily  Zyrtec  Atorvastatin Calcium  Muscle Spasm Med  Pantoprazole Sodium  Pepcid     GU PSH: Cysto Remove Stent FB Sim - 08/27/2019 Ureteroscopic laser litho, Right - 08/05/2019     NON-GU PSH: Cesarean Delivery     GU PMH: Acute Cystitis/UTI - 10/25/2021 Renal calculus, Somewhat inconclusive, KUB today-hinted the possibility of a distal right ureteral calculi which would certainly correlate with her symptoms. Differential also includes recent UTI with appropriate treatment provided prescribed by primary care. - 10/25/2021, Stable right renal calculus., - 06/11/2021, KUB reviewed and pt has a small interpolar stone in her kidney (R). Stone does not currently require any treatment., - 09/25/2020 Urinary Frequency, Overactive bladder symptoms bothersome - 06/11/2021 Urinary Urgency - 06/11/2021 Urinary Calculus, Unspec, she is not having current symptoms of the stone. Last KUB 2 months ago revealed no evidence of renal or ureter - 06/19/2020 Ureteral calculus, She had a ureteral calculus treated in 2020. She does have symptoms similar to a stone but I do not see any on KUB today. There may be small 1 at her UVJ, however. - 04/15/2020, (Stable), Right, Stone remains stable in size and position. Right kidney appears obstructed though she is currently asymptomatic. She will need treatment promptly, - 07/31/2019, - 2020 (Acute), Right, Right distal ureteral stone. It is moderate in size, but may well pass. She is currently asymptomatic. This more than likely accounted for the patient's microscopic hematuria in her gynecologist's office., - 2020 History of urolithiasis - 10/03/2019 Ureteral stricture, Right - 08/27/2019  PMH Notes: Hx of Kidney Stones   NON-GU PMH: Anxiety Diabetes Type  2 Hypercholesterolemia    FAMILY HISTORY: 1 Daughter - Other 1 son - Other Death In The Family Mother - Mother Lung Cancer - Mother   SOCIAL HISTORY: Marital Status: Married Preferred Language: English; Ethnicity: Not Hispanic Or Latino; Race: Asian Current Smoking Status: Patient has never smoked.   Tobacco Use Assessment Completed: Used Tobacco in last 30 days? Does not use smokeless tobacco. Has never drank.  Drinks 1 caffeinated drink per day. 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    REVIEW OF SYSTEMS:    GU Review Female:   Patient reports burning /pain with urination, get up at night to urinate, frequent urination, and leakage of urine. Patient denies hard to postpone urination, have to strain to urinate, stream starts and stops, trouble starting your stream, and being pregnant.  Gastrointestinal (Upper):   Patient denies nausea, vomiting, and indigestion/ heartburn.  Gastrointestinal (Lower):   Patient denies diarrhea and constipation.  Constitutional:   Patient denies fever, night sweats, weight loss, and fatigue.  Skin:   Patient denies skin rash/ lesion and itching.  Eyes:   Patient denies blurred vision and double vision.  Ears/ Nose/ Throat:   Patient denies sore throat and sinus problems.  Hematologic/Lymphatic:   Patient denies swollen glands and easy bruising.  Cardiovascular:   Patient denies leg swelling and chest pains.  Respiratory:   Patient denies cough and shortness of breath.  Endocrine:   Patient denies excessive thirst.  Musculoskeletal:   Patient denies back pain and joint pain.  Neurological:   Patient denies headaches and dizziness.  Psychologic:   Patient denies depression and anxiety.   VITAL SIGNS:      11/08/2021 10:01 AM  Weight 125 lb / 56.7 kg  Height 60 in / 152.4 cm  BP 117/75 mmHg  Heart Rate 81 /min  Temperature 97.5 F / 36.3 C  BMI 24.4 kg/m   MULTI-SYSTEM PHYSICAL EXAMINATION:    Constitutional: Well-nourished. No physical  deformities. Normally developed. Good grooming.  Neck: Neck symmetrical, not swollen. Normal tracheal position.  Respiratory: No labored breathing, no use of accessory muscles.   Cardiovascular: Normal temperature, normal extremity pulses, no swelling, no varicosities.  Skin: No paleness, no jaundice, no cyanosis. No lesion, no ulcer, no rash.  Neurologic / Psychiatric: Oriented to time, oriented to place, oriented to person. No depression, no anxiety, no agitation.  Gastrointestinal: No mass, no tenderness, no rigidity, non obese abdomen. No CVA, flank, lower abdominal tenderness.  Musculoskeletal: Normal gait and station of head and neck.     Complexity of Data:  Source Of History:  Patient, Medical Record Summary  Records Review:   Previous Doctor Records, Previous Hospital Records, Previous Patient Records  Urine Test Review:   Urinalysis, Urine Culture  X-Ray Review: KUB: Reviewed Films. Discussed With Patient.  Renal Ultrasound: Reviewed Films. Discussed With Patient.     11/08/21  Urinalysis  Urine Appearance Clear   Urine Color Yellow   Urine Glucose Neg mg/dL  Urine Bilirubin Neg mg/dL  Urine Ketones Neg mg/dL  Urine Specific Gravity 1.010   Urine Blood Neg ery/uL  Urine pH 5.5   Urine Protein Neg mg/dL  Urine Urobilinogen 0.2 mg/dL  Urine Nitrites Neg   Urine Leukocyte Esterase Trace leu/uL  Urine WBC/hpf 0 - 5/hpf   Urine RBC/hpf 0 - 2/hpf   Urine Epithelial Cells 0 - 5/hpf   Urine Bacteria NS (Not Seen)   Urine  Mucous Not Present   Urine Yeast NS (Not Seen)   Urine Trichomonas Not Present   Urine Cystals NS (Not Seen)   Urine Casts NS (Not Seen)   Urine Sperm Not Present    PROCEDURES:         KUB - 65035  A single view of the abdomen is obtained. An obvious opacity consistent with nephrolithiasis is not seen within the confines of either renal shadow. Previously identified from September of last year right renal calculus is not seen. The anatomical expected  tract of the left ureter is grossly clear. Tracing down the anatomical expected tract of the right ureter, in similar location is identified at last office visit, a 4.4 mm opacity consistent with a distal right ureteral calculus is identified just lateral to the right sacral wing.      . Patient confirmed No Neulasta OnPro Device.            Renal Ultrasound - 46568  Right Kidney: Length: 11.0 cm Depth:6.3 cm Cortical Width:1.2 cm Width:5.5 cm  Left Kidney: Length:10.5 cm Depth:4.4 cm Cortical Width: 1.2 cm Width: 4.7 cm  Left Kidney/Ureter:  wnl  Right Kidney/Ureter:  There is moderate hydro present w hydroureter noted as well. Cannot identify obstructing calc.  Bladder:  PVR 51 ml      . Patient confirmed No Neulasta OnPro Device.           Urinalysis w/Scope Dipstick Dipstick Cont'd Micro  Color: Yellow Bilirubin: Neg mg/dL WBC/hpf: 0 - 5/hpf  Appearance: Clear Ketones: Neg mg/dL RBC/hpf: 0 - 2/hpf  Specific Gravity: 1.010 Blood: Neg ery/uL Bacteria: NS (Not Seen)  pH: 5.5 Protein: Neg mg/dL Cystals: NS (Not Seen)  Glucose: Neg mg/dL Urobilinogen: 0.2 mg/dL Casts: NS (Not Seen)    Nitrites: Neg Trichomonas: Not Present    Leukocyte Esterase: Trace leu/uL Mucous: Not Present      Epithelial Cells: 0 - 5/hpf      Yeast: NS (Not Seen)      Sperm: Not Present    ASSESSMENT:      ICD-10 Details  1 GU:   Ureteral calculus - N20.1 Right, Undiagnosed New Problem  2   Ureteral obstruction secondary to calculous - N13.2 Undiagnosed New Problem   PLAN:           Orders Labs Urine Culture          Schedule Return Visit/Planned Activity: Other See Visit Notes - Follow up MD, Schedule Surgery             Note: I will discuss with her urologist before contacting the patient and scheduling appropriately.          Document Letter(s):  Created for Patient: Clinical Summary         Notes:   From September of this past year patient had a nonobstructing calculus in the right  kidney which is no longer visible, confirmed on ultrasound which demonstrates hydronephrosis. A correlating opacity measuring approximately 4.4 mm is seen just lateral to the right sacral wing representing a likely distal ureteral stone. This opacity was present on KUB at last visit as well and correlates with her continuing to be symptomatic.   For ureteroscopy I described the risks which include heart attack, stroke, pulmonary embolus, death, bleeding, infection, damage to contiguous structures, positioning injury, ureteral stricture, ureteral avulsion, ureteral injury, need for ureteral stent, inability to perform ureteroscopy, need for an interval procedure, inability to clear stone burden, stent discomfort and pain.  For shockwave lithotripsy I described the risks which include arrhythmia, kidney contusion, kidney hemorrhage, need for transfusion, long-term risk of diabetes or hypertension, back discomfort, flank ecchymosis, flank abrasion, inability to break up stone, inability to pass stone fragments, Steinstrasse, infection associated with obstructing stones, need for different surgical procedure and possible need for repeat shockwave lithotripsy.    She has a prior history of ureteral stricture identified on ureteroscopy performed back in 2020. She may ultimately require CT imaging to further confirm KUB and ultrasound findings today. I am going to send a message to her urologist regarding her presentation and once I hear back from him, the patient will be scheduled appropriately needed for follow-up CT imaging or a definitive intervention. She will continue tamsulosin as previously prescribed. Continue efforts at remaining well-hydrated and nourished and monitoring for interval stone material passage. She may continue to use ibuprofen or Tylenol for management of pain/discomfort. With acutely worsening symptomology, I encouraged her to schedule a follow-up appointment or present to the emergency  department in the event the office is closed.

## 2021-11-25 ENCOUNTER — Encounter (HOSPITAL_BASED_OUTPATIENT_CLINIC_OR_DEPARTMENT_OTHER): Payer: Self-pay | Admitting: Urology

## 2021-11-25 ENCOUNTER — Ambulatory Visit (HOSPITAL_BASED_OUTPATIENT_CLINIC_OR_DEPARTMENT_OTHER)
Admission: RE | Admit: 2021-11-25 | Discharge: 2021-11-25 | Disposition: A | Discharge status: Home / Self Care | Payer: Commercial Managed Care - PPO | Attending: Urology | Admitting: Urology

## 2021-11-25 ENCOUNTER — Other Ambulatory Visit: Payer: Self-pay

## 2021-11-25 ENCOUNTER — Encounter (HOSPITAL_BASED_OUTPATIENT_CLINIC_OR_DEPARTMENT_OTHER)
Admission: RE | Disposition: A | Discharge status: Home / Self Care | Payer: Self-pay | Source: Home / Self Care | Attending: Urology

## 2021-11-25 ENCOUNTER — Ambulatory Visit (HOSPITAL_COMMUNITY): Payer: Commercial Managed Care - PPO

## 2021-11-25 DIAGNOSIS — N201 Calculus of ureter: Secondary | ICD-10-CM | POA: Insufficient documentation

## 2021-11-25 DIAGNOSIS — N390 Urinary tract infection, site not specified: Secondary | ICD-10-CM | POA: Diagnosis not present

## 2021-11-25 DIAGNOSIS — N3941 Urge incontinence: Secondary | ICD-10-CM | POA: Insufficient documentation

## 2021-11-25 DIAGNOSIS — B9620 Unspecified Escherichia coli [E. coli] as the cause of diseases classified elsewhere: Secondary | ICD-10-CM | POA: Diagnosis not present

## 2021-11-25 DIAGNOSIS — R1013 Epigastric pain: Secondary | ICD-10-CM | POA: Insufficient documentation

## 2021-11-25 HISTORY — PX: EXTRACORPOREAL SHOCK WAVE LITHOTRIPSY: SHX1557

## 2021-11-25 LAB — GLUCOSE, CAPILLARY: Glucose-Capillary: 109 mg/dL — ABNORMAL HIGH (ref 70–99)

## 2021-11-25 SURGERY — LITHOTRIPSY, ESWL
Anesthesia: LOCAL | Laterality: Right

## 2021-11-25 MED ORDER — DIAZEPAM 5 MG PO TABS
ORAL_TABLET | ORAL | Status: AC
  Filled 2021-11-25: qty 2

## 2021-11-25 MED ORDER — CIPROFLOXACIN HCL 500 MG PO TABS
ORAL_TABLET | ORAL | Status: AC
  Filled 2021-11-25: qty 1

## 2021-11-25 MED ORDER — CIPROFLOXACIN HCL 500 MG PO TABS
500.0000 mg | ORAL_TABLET | ORAL | Status: AC
  Administered 2021-11-25: 500 mg via ORAL

## 2021-11-25 MED ORDER — DIPHENHYDRAMINE HCL 25 MG PO CAPS
25.0000 mg | ORAL_CAPSULE | ORAL | Status: AC
  Administered 2021-11-25: 25 mg via ORAL

## 2021-11-25 MED ORDER — DIPHENHYDRAMINE HCL 25 MG PO CAPS
ORAL_CAPSULE | ORAL | Status: AC
  Filled 2021-11-25: qty 1

## 2021-11-25 MED ORDER — DIAZEPAM 5 MG PO TABS
10.0000 mg | ORAL_TABLET | ORAL | Status: AC
  Administered 2021-11-25: 10 mg via ORAL

## 2021-11-25 MED ORDER — SODIUM CHLORIDE 0.9 % IV SOLN: INTRAVENOUS | Status: DC

## 2021-11-25 MED ORDER — HYDROCODONE-ACETAMINOPHEN 5-325 MG PO TABS: 1.0000 | ORAL_TABLET | ORAL | 0 refills | Status: DC | PRN

## 2021-11-25 NOTE — Interval H&P Note (Signed)
History and Physical Interval Note:  11/25/2021 7:29 AM  Tiffany Hicks  has presented today for surgery, with the diagnosis of RIGHT URETERAL CALCULI.  The various methods of treatment have been discussed with the patient and family. After consideration of risks, benefits and other options for treatment, the patient has consented to  Procedure(s): EXTRACORPOREAL SHOCK WAVE LITHOTRIPSY (ESWL) (Right) as a surgical intervention.  The patient's history has been reviewed, patient examined, no change in status, stable for surgery.  I have reviewed the patient's chart and labs.  Questions were answered to the patient's satisfaction.     Geovana Gebel D Nayden Czajka

## 2021-11-25 NOTE — Op Note (Signed)
See [facility] OP note scanned into chart. Also because of the size, density, location and other factors that cannot be anticipated I feel this will likely be a staged procedure. This fact supersedes any indication in the scanned [facility] operative note to the contrary.  

## 2021-11-25 NOTE — Discharge Instructions (Signed)
See [facility] discharge instructions in chart.  

## 2021-11-26 ENCOUNTER — Encounter (HOSPITAL_BASED_OUTPATIENT_CLINIC_OR_DEPARTMENT_OTHER): Payer: Self-pay | Admitting: Urology

## 2021-11-26 NOTE — Progress Notes (Signed)
No further action needed.

## 2021-12-03 ENCOUNTER — Other Ambulatory Visit: Payer: Self-pay | Admitting: Family Medicine

## 2021-12-09 ENCOUNTER — Encounter: Payer: Self-pay | Admitting: Family Medicine

## 2021-12-09 NOTE — Telephone Encounter (Signed)
Her last hemoglobin A1c was 6.7% about 2 months ago. ?This indicates an average glucose of 140. ?Okay to send in Jardiance 10 mg, 1 tab p.o. daily, #30, refill x1. ?Needs to arrange follow-up with me in about 4 to 6 weeks. ?

## 2021-12-10 MED ORDER — EMPAGLIFLOZIN 10 MG PO TABS: 10.0000 mg | ORAL_TABLET | Freq: Every day | ORAL | 1 refills | Status: DC

## 2021-12-17 ENCOUNTER — Ambulatory Visit: Payer: Commercial Managed Care - PPO | Admitting: Dietician

## 2022-01-06 ENCOUNTER — Other Ambulatory Visit: Payer: Self-pay | Admitting: Family Medicine

## 2022-02-02 ENCOUNTER — Other Ambulatory Visit: Payer: Self-pay | Admitting: Family Medicine

## 2022-02-04 ENCOUNTER — Telehealth: Payer: Self-pay | Admitting: Family Medicine

## 2022-02-04 ENCOUNTER — Other Ambulatory Visit: Payer: Self-pay

## 2022-02-04 MED ORDER — EMPAGLIFLOZIN 10 MG PO TABS: 10.0000 mg | ORAL_TABLET | Freq: Every day | ORAL | 0 refills | Status: DC

## 2022-02-04 NOTE — Telephone Encounter (Signed)
Pt is doing well on Jardiance for her diabetes. She needs a refill sent in to continue. ?

## 2022-02-04 NOTE — Telephone Encounter (Signed)
F/u appt scheduled, 30 day supply sent ?

## 2022-02-09 ENCOUNTER — Ambulatory Visit: Payer: Commercial Managed Care - PPO | Admitting: Family Medicine

## 2022-02-09 ENCOUNTER — Encounter: Payer: Self-pay | Admitting: Family Medicine

## 2022-02-09 VITALS — BP 109/73 | HR 69 | Temp 98.2°F | Ht 60.0 in | Wt 127.4 lb

## 2022-02-09 DIAGNOSIS — E7800 Pure hypercholesterolemia, unspecified: Secondary | ICD-10-CM | POA: Diagnosis not present

## 2022-02-09 DIAGNOSIS — D75839 Thrombocytosis, unspecified: Secondary | ICD-10-CM

## 2022-02-09 DIAGNOSIS — E119 Type 2 diabetes mellitus without complications: Secondary | ICD-10-CM | POA: Diagnosis not present

## 2022-02-09 LAB — LIPID PANEL
Cholesterol: 195 mg/dL (ref 0–200)
HDL: 73.5 mg/dL (ref >39.00)
LDL Cholesterol: 110 mg/dL — ABNORMAL HIGH (ref 0–99)
NonHDL: 121.96
Total CHOL/HDL Ratio: 3
Triglycerides: 62 mg/dL (ref 0.0–149.0)
VLDL: 12.4 mg/dL (ref 0.0–40.0)

## 2022-02-09 LAB — BASIC METABOLIC PANEL
BUN: 18 mg/dL (ref 6–23)
CO2: 26 mEq/L (ref 19–32)
Calcium: 9.5 mg/dL (ref 8.4–10.5)
Chloride: 104 mEq/L (ref 96–112)
Creatinine, Ser: 0.63 mg/dL (ref 0.40–1.20)
GFR: 101.65 mL/min (ref >60.00)
Glucose, Bld: 91 mg/dL (ref 70–99)
Potassium: 4.2 mEq/L (ref 3.5–5.1)
Sodium: 139 mEq/L (ref 135–145)

## 2022-02-09 LAB — CBC
HCT: 45 % (ref 36.0–46.0)
Hemoglobin: 14.6 g/dL (ref 12.0–15.0)
MCHC: 32.6 g/dL (ref 30.0–36.0)
MCV: 91.2 fl (ref 78.0–100.0)
Platelets: 259 10*3/uL (ref 150.0–400.0)
RBC: 4.93 Mil/uL (ref 3.87–5.11)
RDW: 14 % (ref 11.5–15.5)
WBC: 5.3 10*3/uL (ref 4.0–10.5)

## 2022-02-09 LAB — MICROALBUMIN / CREATININE URINE RATIO
Creatinine,U: 50.1 mg/dL
Microalb Creat Ratio: 1.7 mg/g (ref 0.0–30.0)
Microalb, Ur: 0.8 mg/dL (ref 0.0–1.9)

## 2022-02-09 LAB — POCT GLYCOSYLATED HEMOGLOBIN (HGB A1C)
HbA1c POC (<> result, manual entry): 6.2 % (ref 4.0–5.6)
HbA1c, POC (controlled diabetic range): 6.2 % (ref 0.0–7.0)
HbA1c, POC (prediabetic range): 6.2 % (ref 5.7–6.4)
Hemoglobin A1C: 6.2 % — AB (ref 4.0–5.6)

## 2022-02-09 MED ORDER — EMPAGLIFLOZIN 10 MG PO TABS: 10.0000 mg | ORAL_TABLET | Freq: Every day | ORAL | 1 refills | Status: DC

## 2022-02-09 NOTE — Progress Notes (Signed)
OFFICE VISIT ? ?02/09/2022 ? ?CC:  ?Chief Complaint  ?Patient presents with  ? Diabetes  ?  Follow up; pt is fasting  ? ?Patient is a 51 y.o. female who presents for 4 mo f/u DM 2 and HLD. ?A/P as of last visit: ?"#1 dyspepsia/gastritis.  Suspect a lot of the symptoms she was having were side effects of metformin. ?Continue daily pantoprazole and Pepcid for the next couple of weeks and then stop. ?Some weight loss have been occurring but she is up 3 pounds over the last 10 days. ?Extensive blood work last visit all normal. ?She noted some concern today that her platelets rose from 282K to 376K. ?We discussed the fact that she has had some normal fluctuations in platelet levels that are all within the normal level.  I think is reasonable to repeat this test at next follow-up in 3 months. ?  ?#2 fatigue.  Suspect postviral plus some contribution from #1 above. ?As stated above, all labs normal. ?Reassured. ? ?3.  UTI--- E. Coli.  History of gross hematuria episode. ?Urine appears normal today and symptoms are clearing with recent 8-day treatment with Cipro. ?  ?#4 family history of lung cancer. ?Her mother was diagnosed with lung cancer.  Her mother was not a smoker. ?Patient concerned and wonders if there is a screening test available. ?I told her it was not sure whether chest CT scan would be appropriate screening test in this situation. ?I said it would be best to get the opinion of an oncologist or medical geneticist.  Offered referral.  She chose to hold off at this time. ?  ?#5 diabetes type 2. ?Intolerant of metformin. ?Hemoglobin A1c last visit was 6.7%.  Next A1c due around 01/13/2022. ?No medications at this time.  We will check on the nutritionist referral that we made last visit. ?She will continue to check glucose once a day" ? ?INTERIM HX: ?Tiffany Hicks is doing well.  Working part-time.  Says she walks regularly for exercise. ?Since last visit I started her on jardiance '10mg'$  (2 mo ago) and got her off metformin  due to some GI side effects. ?Home glucose when fasting is around 115-120 consistently. ?Side effects from the [place]. ? ?She is taking atorvastatin 80 mg a day but somewhere along the line stop taking Zetia because she did not know she had to refill it. ? ?ROS as above, plus--> no fevers, no CP, no SOB, no wheezing, no cough, no dizziness, no HAs, no rashes, no melena/hematochezia.  No polyuria or polydipsia.  No myalgias or arthralgias.  No focal weakness, paresthesias, or tremors.   No dysuria or unusual/new urinary urgency or frequency.  No recent changes in lower legs. ?No n/v/d or abd pain.  No palpitations.   ? ? ?Past Medical History:  ?Diagnosis Date  ? Allergic rhinitis   ? Environmental allergens--multiple.  Patient to start immunotherapy 2023  ? Diabetes mellitus ([place]) 05/2018  ? Hba1c 6.5%  ? Diverticulosis 06/2021  ? colonoscopy  ? Food allergy   ? peanut, cucumber, mushroom, orange, sesame seed--question of?--but all food allergy testing neg 2023 at allergist  ? Gestational diabetes   ? Both pregnancies.  ? Hyperlipemia, mixed 05/2018  ? Great response to statin  ? Left carpal tunnel syndrome   ? Migraines   ? Multiple thyroid nodules   ? FNA of one nodule: benign follic nodule/cyst on 11/14/93.  f/u u/s 08/2019 stable nodules, no criteria for bx.  Rpt 01/2021->2 new nodules, need f/u  u/s 1 yr.  Annual repeat rec'd until stable for 5 yrs (Dr. Radene Knee).  ? Ureterolithiasis   ? R Ureteroscopy with holmium laser 08/05/19. CaOx. Right sided ESWL 11/2021  ? ? ?Past Surgical History:  ?Procedure Laterality Date  ? BIOPSY THYROID Right 11/15/2017  ? benign.    ? CESAREAN SECTION  08/03/2011  ? Procedure: CESAREAN SECTION;  Surgeon: Margarette Asal;  Location: [facility];  Service: Gynecology;  Laterality: N/A;  ? COLONOSCOPY  06/29/2020  ? no adenomas. Recall 2031  ? CYSTOSCOPY WITH RETROGRADE PYELOGRAM, URETEROSCOPY AND STENT PLACEMENT Right 08/05/2019  ? Procedure: CYSTOSCOPY WITH RETROGRADE PYELOGRAM,  URETEROSCOPY AND STENT PLACEMENTwith laser;  Surgeon: Franchot Gallo, MD;  Location: [facility];  Service: Urology;  Laterality: Right;  1 HR  ? EXTRACORPOREAL SHOCK WAVE LITHOTRIPSY Right 11/25/2021  ? Procedure: EXTRACORPOREAL SHOCK WAVE LITHOTRIPSY (ESWL);  Surgeon: Robley Fries, MD;  Location: [facility];  Service: Urology;  Laterality: Right;  ? HOLMIUM LASER APPLICATION Right 83/38/2505  ? Procedure: HOLMIUM LASER APPLICATION;  Surgeon: Franchot Gallo, MD;  Location: [facility];  Service: Urology;  Laterality: Right;  ? [place] EXTRACTION  2000  ? ? ?Outpatient Medications Prior to Visit  ?Medication Sig Dispense Refill  ? atorvastatin (LIPITOR) 80 MG tablet Take 1 tablet (80 mg total) by mouth daily. 90 tablet 3  ? azelastine (ASTELIN) 0.1 % nasal spray     ? empagliflozin (JARDIANCE) 10 MG TABS tablet Take 1 tablet (10 mg total) by mouth daily. 30 tablet 0  ? cetirizine (ZYRTEC) 10 MG tablet Take 10 mg by mouth daily. Patient takes as needed (Patient not taking: Reported on 02/09/2022)    ? ezetimibe (ZETIA) 10 MG tablet Take 1 tablet (10 mg total) by mouth daily. (Patient not taking: Reported on 10/15/2021) 30 tablet 3  ? famotidine (PEPCID) 20 MG tablet Take 1 tablet (20 mg total) by mouth 2 (two) times daily. (Patient not taking: Reported on 10/15/2021) 60 tablet 1  ? levocetirizine (XYZAL) 5 MG tablet levocetirizine 5 mg tablet ? TAKE 1 TABLET BY MOUTH EVERY EVENING (Patient not taking: Reported on 06/29/2021)    ? pantoprazole (PROTONIX) 40 MG tablet Take 1 tablet (40 mg total) by mouth daily. (Patient not taking: Reported on 02/09/2022) 30 tablet 0  ? HYDROcodone-acetaminophen (NORCO/VICODIN) 5-325 MG tablet Take 1 tablet by mouth every 4 (four) hours as needed for moderate pain. (Patient not taking: Reported on 02/09/2022) 15 tablet 0  ? metFORMIN (GLUCOPHAGE) 500 MG tablet Take by mouth 2 (two) times daily with a meal. Patient states that she thinks dose is '400mg'$ . (Patient not taking:  Reported on 02/09/2022)    ? ?No facility-administered medications prior to visit.  ? ? ?Allergies  ?Allergen Reactions  ? Metformin And Related Other (See Comments)  ?  UPSET STOMACH  ? Cucumber Extract Rash  ? Mushroom Extract Complex Rash  ? Peanut-Containing Drug Products Rash  ? Soy Allergy Rash  ? ? ?ROS ?As per HPI ? ?PE: ? ?  02/09/2022  ?  9:17 AM 11/25/2021  ? 10:00 AM 11/25/2021  ?  6:30 AM  ?Vitals with BMI  ?Height '5\' 0"'$   '5\' 0"'$   ?Weight 127 lbs 6 oz  126 lbs  ?BMI 24.88  24.61  ?Systolic 397 673 419  ?Diastolic 73 98 90  ?Pulse 69 63 66  ? ?Physical Exam ? ?Gen: Alert, well appearing.  Patient is oriented to person, place, time, and situation. ?AFFECT: pleasant, lucid thought  and speech. ?CV: RRR, no m/r/g.   ?LUNGS: CTA bilat, nonlabored resps, good aeration in all lung fields. ?EXT: no clubbing or cyanosis.  no edema.  ? ? ?LABS:  ?Last CBC ?Lab Results  ?Component Value Date  ? WBC 6.3 10/15/2021  ? HGB 12.0 10/15/2021  ? HCT 36.3 10/15/2021  ? MCV 89.4 10/15/2021  ? MCH 29.5 08/02/2019  ? RDW 14.7 10/15/2021  ? PLT 376.0 10/15/2021  ? ?Last metabolic panel ?Lab Results  ?Component Value Date  ? GLUCOSE 88 10/15/2021  ? NA 139 10/15/2021  ? K 4.2 10/15/2021  ? CL 102 10/15/2021  ? CO2 25 10/15/2021  ? BUN 14 10/15/2021  ? CREATININE 0.60 10/15/2021  ? GFRNONAA >60 08/02/2019  ? CALCIUM 9.5 10/15/2021  ? PROT 7.5 10/15/2021  ? ALBUMIN 3.9 10/15/2021  ? BILITOT 0.6 10/15/2021  ? ALKPHOS 83 10/15/2021  ? AST 15 10/15/2021  ? ALT 17 10/15/2021  ? ANIONGAP 9 08/02/2019  ? ?Last lipids ?Lab Results  ?Component Value Date  ? CHOL 154 06/29/2021  ? HDL 47.60 06/29/2021  ? [place] 86 06/29/2021  ? TRIG 105.0 06/29/2021  ? CHOLHDL 3 06/29/2021  ? ?Last hemoglobin A1c ?Lab Results  ?Component Value Date  ? HGBA1C 6.2 (A) 02/09/2022  ? HGBA1C 6.2 02/09/2022  ? HGBA1C 6.2 02/09/2022  ? HGBA1C 6.2 02/09/2022  ? ?Last thyroid functions ?Lab Results  ?Component Value Date  ? TSH 0.52 10/15/2021  ? T3TOTAL 100 10/15/2021   ? ?POC Hba1c ?IMPRESSION AND PLAN: ? ?#1 type 2 diabetes without complication. ?Point-of-care A1c 6.2% today. ?Continue Jardiance 10 mg a day. ?Electrolytes and creatinine checked today. ?Urine microalbumin/cre

## 2022-02-10 ENCOUNTER — Telehealth: Payer: Self-pay

## 2022-02-10 MED ORDER — EZETIMIBE 10 MG PO TABS: 10.0000 mg | ORAL_TABLET | Freq: Every day | ORAL | 1 refills | Status: DC

## 2022-02-10 NOTE — Telephone Encounter (Signed)
-----   Message from Tammi Sou, MD sent at 02/10/2022  8:07 AM EDT ----- ?All labs excellent except LDL cholesterol was 110. ?Her goal is less than 70. ?Restart Zetia.  Please do prescription for generic Zetia 10 mg, 1 tab p.o. daily, #90, refill x1. ?Continue all other medicines as is ?

## 2022-02-17 ENCOUNTER — Ambulatory Visit: Payer: Commercial Managed Care - PPO | Admitting: Dietician

## 2022-08-06 ENCOUNTER — Other Ambulatory Visit: Payer: Self-pay | Admitting: Family Medicine

## 2022-08-08 ENCOUNTER — Other Ambulatory Visit: Payer: Self-pay | Admitting: Family Medicine

## 2022-08-16 NOTE — Patient Instructions (Incomplete)
You can buy wrist splints for your carpal tunnel syndrome at any pharmacy without a prescription.   Health Maintenance, Female Adopting a healthy lifestyle and getting preventive care are important in promoting health and wellness. Ask your health care provider about: The right schedule for you to have regular tests and exams. Things you can do on your own to prevent diseases and keep yourself healthy. What should I know about diet, weight, and exercise? Eat a healthy diet  Eat a diet that includes plenty of vegetables, fruits, low-fat dairy products, and lean protein. Do not eat a lot of foods that are high in solid fats, added sugars, or sodium. Maintain a healthy weight Body mass index (BMI) is used to identify weight problems. It estimates body fat based on height and weight. Your health care provider can help determine your BMI and help you achieve or maintain a healthy weight. Get regular exercise Get regular exercise. This is one of the most important things you can do for your health. Most adults should: Exercise for at least 150 minutes each week. The exercise should increase your heart rate and make you sweat (moderate-intensity exercise). Do strengthening exercises at least twice a week. This is in addition to the moderate-intensity exercise. Spend less time sitting. Even light physical activity can be beneficial. Watch cholesterol and blood lipids Have your blood tested for lipids and cholesterol at 53 years of age, then have this test every 5 years. Have your cholesterol levels checked more often if: Your lipid or cholesterol levels are high. You are older than 53 years of age. You are at high risk for heart disease. What should I know about cancer screening? Depending on your health history and family history, you may need to have cancer screening at various ages. This may include screening for: Breast cancer. Cervical cancer. Colorectal cancer. Skin cancer. Lung  cancer. What should I know about heart disease, diabetes, and high blood pressure? Blood pressure and heart disease High blood pressure causes heart disease and increases the risk of stroke. This is more likely to develop in people who have high blood pressure readings or are overweight. Have your blood pressure checked: Every 3-5 years if you are 56-15 years of age. Every year if you are 34 years old or older. Diabetes Have regular diabetes screenings. This checks your fasting blood sugar level. Have the screening done: Once every three years after age 12 if you are at a normal weight and have a low risk for diabetes. More often and at a younger age if you are overweight or have a high risk for diabetes. What should I know about preventing infection? Hepatitis B If you have a higher risk for hepatitis B, you should be screened for this virus. Talk with your health care provider to find out if you are at risk for hepatitis B infection. Hepatitis C Testing is recommended for: Everyone born from [birth year] through 1967. Anyone with known risk factors for hepatitis C. Sexually transmitted infections (STIs) Get screened for STIs, including gonorrhea and chlamydia, if: You are sexually active and are younger than 53 years of age. You are older than 53 years of age and your health care provider tells you that you are at risk for this type of infection. Your sexual activity has changed since you were last screened, and you are at increased risk for chlamydia or gonorrhea. Ask your health care provider if you are at risk. Ask your health care provider about whether you are  at high risk for HIV. Your health care provider may recommend a prescription medicine to help prevent HIV infection. If you choose to take medicine to prevent HIV, you should first get tested for HIV. You should then be tested every 3 months for as long as you are taking the medicine. Pregnancy If you are about to stop having your  period (premenopausal) and you may become pregnant, seek counseling before you get pregnant. Take 400 to 800 micrograms (mcg) of folic acid every day if you become pregnant. Ask for birth control (contraception) if you want to prevent pregnancy. Osteoporosis and menopause Osteoporosis is a disease in which the bones lose minerals and strength with aging. This can result in bone fractures. If you are 93 years old or older, or if you are at risk for osteoporosis and fractures, ask your health care provider if you should: Be screened for bone loss. Take a calcium or vitamin D supplement to lower your risk of fractures. Be given hormone replacement therapy (HRT) to treat symptoms of menopause. Follow these instructions at home: Alcohol use Do not drink alcohol if: Your health care provider tells you not to drink. You are pregnant, may be pregnant, or are planning to become pregnant. If you drink alcohol: Limit how much you have to: 0-1 drink a day. Know how much alcohol is in your drink. In the [place] one drink equals one 12 oz bottle of beer (355 mL), one 5 oz glass of wine (148 mL), or one 1 oz glass of hard liquor (44 mL). Lifestyle Do not use any products that contain nicotine or tobacco. These products include cigarettes, chewing tobacco, and vaping devices, such as e-cigarettes. If you need help quitting, ask your health care provider. Do not use street drugs. Do not share needles. Ask your health care provider for help if you need support or information about quitting drugs. General instructions Schedule regular health, dental, and eye exams. Stay current with your vaccines. Tell your health care provider if: You often feel depressed. You have ever been abused or do not feel safe at home. Summary Adopting a healthy lifestyle and getting preventive care are important in promoting health and wellness. Follow your health care provider's instructions about healthy diet, exercising, and  getting tested or screened for diseases. Follow your health care provider's instructions on monitoring your cholesterol and blood pressure. This information is not intended to replace advice given to you by your health care provider. Make sure you discuss any questions you have with your health care provider. Document Revised: 02/15/2021 Document Reviewed: 02/15/2021 Elsevier Patient Education  [place].

## 2022-08-18 ENCOUNTER — Ambulatory Visit (INDEPENDENT_AMBULATORY_CARE_PROVIDER_SITE_OTHER): Payer: Commercial Managed Care - PPO | Admitting: Family Medicine

## 2022-08-18 ENCOUNTER — Encounter: Payer: Self-pay | Admitting: Family Medicine

## 2022-08-18 VITALS — BP 108/72 | HR 73 | Temp 98.2°F | Ht 59.75 in | Wt 126.8 lb

## 2022-08-18 DIAGNOSIS — M3500 Sicca syndrome, unspecified: Secondary | ICD-10-CM

## 2022-08-18 DIAGNOSIS — E119 Type 2 diabetes mellitus without complications: Secondary | ICD-10-CM | POA: Diagnosis not present

## 2022-08-18 DIAGNOSIS — Z0000 Encounter for general adult medical examination without abnormal findings: Secondary | ICD-10-CM | POA: Diagnosis not present

## 2022-08-18 DIAGNOSIS — G5602 Carpal tunnel syndrome, left upper limb: Secondary | ICD-10-CM | POA: Diagnosis not present

## 2022-08-18 DIAGNOSIS — E7800 Pure hypercholesterolemia, unspecified: Secondary | ICD-10-CM

## 2022-08-18 LAB — LIPID PANEL
Cholesterol: 170 mg/dL (ref 0–200)
HDL: 59.6 mg/dL (ref >39.00)
LDL Cholesterol: 93 mg/dL (ref 0–99)
NonHDL: 110.29
Total CHOL/HDL Ratio: 3
Triglycerides: 88 mg/dL (ref 0.0–149.0)
VLDL: 17.6 mg/dL (ref 0.0–40.0)

## 2022-08-18 LAB — COMPREHENSIVE METABOLIC PANEL
ALT: 28 U/L (ref 0–35)
AST: 18 U/L (ref 0–37)
Albumin: 4.3 g/dL (ref 3.5–5.2)
Alkaline Phosphatase: 70 U/L (ref 39–117)
BUN: 16 mg/dL (ref 6–23)
CO2: 28 mEq/L (ref 19–32)
Calcium: 9.5 mg/dL (ref 8.4–10.5)
Chloride: 106 mEq/L (ref 96–112)
Creatinine, Ser: 0.58 mg/dL (ref 0.40–1.20)
GFR: 103.32 mL/min (ref >60.00)
Glucose, Bld: 99 mg/dL (ref 70–99)
Potassium: 3.9 mEq/L (ref 3.5–5.1)
Sodium: 140 mEq/L (ref 135–145)
Total Bilirubin: 0.6 mg/dL (ref 0.2–1.2)
Total Protein: 7.3 g/dL (ref 6.0–8.3)

## 2022-08-18 LAB — HEMOGLOBIN A1C: Hgb A1c MFr Bld: 6.9 % — ABNORMAL HIGH (ref 4.6–6.5)

## 2022-08-18 LAB — CBC
HCT: 42.8 % (ref 36.0–46.0)
Hemoglobin: 14.2 g/dL (ref 12.0–15.0)
MCHC: 33.1 g/dL (ref 30.0–36.0)
MCV: 90.4 fl (ref 78.0–100.0)
Platelets: 242 10*3/uL (ref 150.0–400.0)
RBC: 4.73 Mil/uL (ref 3.87–5.11)
RDW: 13.4 % (ref 11.5–15.5)
WBC: 5.1 10*3/uL (ref 4.0–10.5)

## 2022-08-18 LAB — TSH: TSH: 1.1 u[IU]/mL (ref 0.35–5.50)

## 2022-08-18 MED ORDER — EMPAGLIFLOZIN 10 MG PO TABS: 10.0000 mg | ORAL_TABLET | Freq: Every day | ORAL | 3 refills | Status: DC

## 2022-08-18 MED ORDER — EZETIMIBE 10 MG PO TABS: 10.0000 mg | ORAL_TABLET | Freq: Every day | ORAL | 1 refills | Status: DC

## 2022-08-18 MED ORDER — ATORVASTATIN CALCIUM 80 MG PO TABS: 80.0000 mg | ORAL_TABLET | Freq: Every day | ORAL | 3 refills | Status: DC

## 2022-08-18 NOTE — Progress Notes (Signed)
Office Note 08/18/2022  CC:  Chief Complaint  Patient presents with   Annual Exam    Pt is fasting   HPI:  Patient is a 51 y.o. female who is here for annual health maintenance exam and follow-up hypercholesterolemia. Feeling well.  Her main concern today is dry eyes.  They feel uncomfortable at times and cause some intermittent feeling of blurred vision.  No eye pain or drainage or redness. Minimal eye itching.  She has tried over-the-counter moisturizing drops.  Recent ophthalmologist exam normal. Endorses dry mouth problems as well.  She is no longer on antihistamines.  She saw [facility] endocrinology in June 2023 but says she will not be returning. Some tingling in fingers and distribution of median nerve, left greater than right.  I added Zetia 10 mg a day 6 months ago when her LDL was 110 despite being on a atorvastatin 80 mg a day. She never picked the medication up says she forgot.  Past Medical History:  Diagnosis Date   Allergic rhinitis    Environmental allergens--multiple.  Patient to start immunotherapy 2023   Diabetes mellitus ([place]) 05/2018   Hba1c 6.5%   Diverticulosis 06/2021   colonoscopy   Food allergy    peanut, cucumber, mushroom, orange, sesame seed--question of?--but all food allergy testing neg 2023 at allergist   Gestational diabetes    Both pregnancies.   Hyperlipemia, mixed 05/2018   Great response to statin   Left carpal tunnel syndrome    Migraines    Multiple thyroid nodules    FNA of one nodule: benign follic nodule/cyst on 04/17/23.  f/u u/s 08/2019 stable nodules, no criteria for bx.  Rpt 01/2021->2 new nodules, need f/u u/s 1 yr.  Annual repeat rec'd until stable for 5 yrs (Dr. Radene Knee).   Ureterolithiasis    R Ureteroscopy with holmium laser 08/05/19. CaOx. Right sided ESWL 11/2021    Past Surgical History:  Procedure Laterality Date   BIOPSY THYROID Right 11/15/2017   benign.     CESAREAN SECTION  08/03/2011   Procedure: CESAREAN  SECTION;  Surgeon: Margarette Asal;  Location: [facility];  Service: Gynecology;  Laterality: N/A;   COLONOSCOPY  06/29/2020   no adenomas. Recall 2031   CYSTOSCOPY WITH RETROGRADE PYELOGRAM, URETEROSCOPY AND STENT PLACEMENT Right 08/05/2019   Procedure: CYSTOSCOPY WITH RETROGRADE PYELOGRAM, URETEROSCOPY AND STENT PLACEMENTwith laser;  Surgeon: Franchot Gallo, MD;  Location: [facility];  Service: Urology;  Laterality: Right;  1 HR   EXTRACORPOREAL SHOCK WAVE LITHOTRIPSY Right 11/25/2021   Procedure: EXTRACORPOREAL SHOCK WAVE LITHOTRIPSY (ESWL);  Surgeon: Robley Fries, MD;  Location: [facility];  Service: Urology;  Laterality: Right;   HOLMIUM LASER APPLICATION Right 23/53/6144   Procedure: HOLMIUM LASER APPLICATION;  Surgeon: Franchot Gallo, MD;  Location: [facility];  Service: Urology;  Laterality: Right;   WISDOM TOOTH EXTRACTION  2000    Family History  Problem Relation Age of Onset   Hypertension Mother    Diabetes Mother    Lung cancer Mother        stage 66, non-smoker   Hypertension Father    Hyperlipidemia Father    Diabetes Sister    Hyperlipidemia Brother    Diabetes Brother    Diabetes Brother    Hyperlipidemia Brother    Colon polyps Neg Hx    Colon cancer Neg Hx    Esophageal cancer Neg Hx    Stomach cancer Neg Hx    Rectal cancer Neg Hx  Social History   Socioeconomic History   Marital status: Married    Spouse name: Not on file   Number of children: Not on file   Years of education: Not on file   Highest education level: Master's degree (e.g., MA, MS, MEng, MEd, MSW, MBA)  Occupational History   Not on file  Tobacco Use   Smoking status: Never   Smokeless tobacco: Never  Vaping Use   Vaping Use: Never used  Substance and Sexual Activity   Alcohol use: No   Drug use: No   Sexual activity: Yes    Birth control/protection: None  Other Topics Concern   Not on file  Social History Narrative   Married, 2 children.   Lives in [place].   Educ: Masters   Personnel officer at [employer].   Social Determinants of Health   Financial Resource Strain: Low Risk  (10/12/2021)   Overall Financial Resource Strain (CARDIA)    Difficulty of Paying Living Expenses: Not hard at all  Food Insecurity: No Food Insecurity (10/12/2021)   Hunger Vital Sign    Worried About Running Out of Food in the Last Year: Never true    Ran Out of Food in the Last Year: Never true  Transportation Needs: No Transportation Needs (10/12/2021)   PRAPARE - Hydrologist (Medical): No    Lack of Transportation (Non-Medical): No  Physical Activity: Unknown (10/12/2021)   Exercise Vital Sign    Days of Exercise per Week: Patient refused    Minutes of Exercise per Session: Not on file  Stress: Unknown (10/12/2021)   [place]    Feeling of Stress : Patient refused  Social Connections: Unknown (10/12/2021)   Social Connection and Isolation Panel [NHANES]    Frequency of Communication with Friends and Family: Patient refused    Frequency of Social Gatherings with Friends and Family: Patient refused    Attends Religious Services: Patient refused    Marine scientist or Organizations: Patient refused    Attends Music therapist: Not on file    Marital Status: Married  Human resources officer Violence: Not on file    Outpatient Medications Prior to Visit  Medication Sig Dispense Refill   hydrocortisone 2.5 % cream Apply 1 Application topically 2 (two) times daily.     atorvastatin (LIPITOR) 80 MG tablet Take 1 tablet (80 mg total) by mouth daily. 90 tablet 3   empagliflozin (JARDIANCE) 10 MG TABS tablet Take 1 tablet (10 mg total) by mouth daily. 90 tablet 1   ezetimibe (ZETIA) 10 MG tablet Take 1 tablet (10 mg total) by mouth daily. 90 tablet 1   azelastine (ASTELIN) 0.1 % nasal spray  (Patient not taking: Reported on 08/18/2022)     cetirizine (ZYRTEC) 10  MG tablet Take 10 mg by mouth daily. Patient takes as needed (Patient not taking: Reported on 02/09/2022)     famotidine (PEPCID) 20 MG tablet Take 1 tablet (20 mg total) by mouth 2 (two) times daily. (Patient not taking: Reported on 10/15/2021) 60 tablet 1   levocetirizine (XYZAL) 5 MG tablet levocetirizine 5 mg tablet  TAKE 1 TABLET BY MOUTH EVERY EVENING (Patient not taking: Reported on 06/29/2021)     pantoprazole (PROTONIX) 40 MG tablet Take 1 tablet (40 mg total) by mouth daily. (Patient not taking: Reported on 02/09/2022) 30 tablet 0   No facility-administered medications prior to visit.  Allergies  Allergen Reactions   Metformin And Related Other (See Comments)    UPSET STOMACH   Cucumber Extract Rash   Mushroom Extract Complex Rash   Peanut-Containing Drug Products Rash   Soy Allergy Rash    ROS Review of Systems  Constitutional:  Negative for appetite change, chills, fatigue and fever.  HENT:  Negative for congestion, dental problem, ear pain and sore throat.   Eyes:  Negative for discharge, redness and visual disturbance.  Respiratory:  Negative for cough, chest tightness, shortness of breath and wheezing.   Cardiovascular:  Negative for chest pain, palpitations and leg swelling.  Gastrointestinal:  Negative for abdominal pain, blood in stool, diarrhea, nausea and vomiting.  Genitourinary:  Negative for difficulty urinating, dysuria, flank pain, frequency, hematuria and urgency.  Musculoskeletal:  Negative for arthralgias, back pain, joint swelling, myalgias and neck stiffness.  Skin:  Negative for pallor and rash.  Neurological:  Negative for dizziness, speech difficulty, weakness and headaches.  Hematological:  Negative for adenopathy. Does not bruise/bleed easily.  Psychiatric/Behavioral:  Negative for confusion and sleep disturbance. The patient is not nervous/anxious.     PE;    08/18/2022    8:16 AM 02/09/2022    9:17 AM 11/25/2021   10:00 AM  Vitals with BMI  Height  4' 11.75" '5\' 0"'$    Weight 126 lbs 13 oz 127 lbs 6 oz   BMI 38.75 64.33   Systolic 295 188 416  Diastolic 72 73 98  Pulse 73 69 63    Exam chaperoned by Deveron Furlong, CMA. Gen: Alert, well appearing.  Patient is oriented to person, place, time, and situation. AFFECT: pleasant, lucid thought and speech. ENT: Ears: EACs clear, normal epithelium.  TMs with good light reflex and landmarks bilaterally.  Eyes: no injection, icteris, swelling, or exudate.  EOMI, PERRLA. Nose: no drainage or turbinate edema/swelling.  No injection or focal lesion.  Mouth: lips without lesion/swelling.  Oral mucosa pink and moist.  Dentition intact and without obvious caries or gingival swelling.  Oropharynx without erythema, exudate, or swelling.  Neck: supple/nontender.  No LAD, mass, or TM.  Carotid pulses 2+ bilaterally, without bruits. CV: RRR, no m/r/g.   LUNGS: CTA bilat, nonlabored resps, good aeration in all lung fields. ABD: soft, NT, ND, BS normal.  No hepatospenomegaly or mass.  No bruits. EXT: no clubbing, cyanosis, or edema.  Phalen's test elicits worsening of left thumb decreased sensation.  Tinel's testing negative Good strength in the wrist and hands.  Sensation normal. Musculoskeletal: no joint swelling, erythema, warmth, or tenderness.  ROM of all joints intact. Skin - no sores or suspicious lesions or rashes or color changes Foot exam - no swelling, tenderness or skin or vascular lesions. Color and temperature is normal. Sensation is intact. Peripheral pulses are palpable. Toenails are normal.  Pertinent labs:  Lab Results  Component Value Date   TSH 0.52 10/15/2021   Lab Results  Component Value Date   WBC 5.3 02/09/2022   HGB 14.6 02/09/2022   HCT 45.0 02/09/2022   MCV 91.2 02/09/2022   PLT 259.0 02/09/2022   Lab Results  Component Value Date   CREATININE 0.63 02/09/2022   BUN 18 02/09/2022   NA 139 02/09/2022   K 4.2 02/09/2022   CL 104 02/09/2022   CO2 26 02/09/2022    Lab Results  Component Value Date   ALT 17 10/15/2021   AST 15 10/15/2021   ALKPHOS 83 10/15/2021   BILITOT 0.6 10/15/2021  Lab Results  Component Value Date   CHOL 195 02/09/2022   Lab Results  Component Value Date   HDL 73.50 02/09/2022   Lab Results  Component Value Date   LDLCALC 110 (H) 02/09/2022   Lab Results  Component Value Date   TRIG 62.0 02/09/2022   Lab Results  Component Value Date   CHOLHDL 3 02/09/2022   Lab Results  Component Value Date   HGBA1C 6.2 (A) 02/09/2022   HGBA1C 6.2 02/09/2022   HGBA1C 6.2 02/09/2022   HGBA1C 6.2 02/09/2022   ASSESSMENT AND PLAN:   #1 health maintenance exam: Reviewed age and gender appropriate health maintenance issues (prudent diet, regular exercise, health risks of tobacco and excessive alcohol, use of seatbelts, fire alarms in home, use of sunscreen).  Also reviewed age and gender appropriate health screening as well as vaccine recommendations. Vaccines: Flu->UTD. Otherwise all utd. Labs: fasting HP, Hba1c. Cervical ca screening: per pt's GYN. Breast ca screening: per pt's GYN. Colon ca screening: recall 2031.  #2 type 2 diabetes without complication. Historically well controlled on Jardiance 10 mg a day.  Diet is good. Feet exam normal today.  Eye exam and urine microalbumin/creatinine screening up-to-date. Hemoglobin A1c and fasting glucose today.  #3 hyperlipidemia. LDL was 110 when checked 6 months ago on atorvastatin 80 mg a day. Added Zetia but patient says she forgot to pick this up. Lipid panel and hepatic panel today. Sent Zetia in again.  #4 sicca syndrome. Checking Sjogren's antibodies today.  #5 carpal tunnel syndrome on the left. Recommended wrist splints for nighttime use, activity modification as well.  An After Visit Summary was printed and given to the patient.  FOLLOW UP:  Return in about 3 months (around 11/18/2022) for routine chronic illness f/u.  Signed:  [name], MD            08/18/2022

## 2022-08-19 LAB — SJOGREN'S SYNDROME ANTIBODS(SSA + SSB)
SSA (Ro) (ENA) Antibody, IgG: <1 AI
SSB (La) (ENA) Antibody, IgG: <1 AI

## 2022-08-23 ENCOUNTER — Telehealth: Payer: Self-pay

## 2022-08-23 MED ORDER — PIOGLITAZONE HCL 15 MG PO TABS: 15.0000 mg | ORAL_TABLET | Freq: Every day | ORAL | 2 refills | Status: DC

## 2022-08-23 NOTE — Telephone Encounter (Signed)
-----   Message from Tammi Sou, MD sent at 08/22/2022  5:20 PM EST ----- Hemoglobin A1c has gone up some to 6.9%. This is still at goal of less than 7% but if we want to be more aggressive we can start an additional diabetes pill.  If patient open to adding a medication please send in prescription for pioglitazone 15 mg, 1 tab p.o. daily, #30, refill x2.  Her LDL cholesterol is 93.  Goal is less than 70.  Start Zetia as we discussed at office visit.  Continue all medications.

## 2022-09-14 LAB — HM MAMMOGRAPHY

## 2022-09-28 ENCOUNTER — Other Ambulatory Visit: Payer: Self-pay | Admitting: Obstetrics and Gynecology

## 2022-09-28 DIAGNOSIS — E041 Nontoxic single thyroid nodule: Secondary | ICD-10-CM

## 2022-10-11 ENCOUNTER — Other Ambulatory Visit: Payer: Self-pay | Admitting: Family Medicine

## 2022-10-21 ENCOUNTER — Ambulatory Visit
Admission: RE | Admit: 2022-10-21 | Discharge: 2022-10-21 | Disposition: A | Discharge status: Home / Self Care | Payer: Commercial Managed Care - PPO | Source: Ambulatory Visit | Attending: Obstetrics and Gynecology | Admitting: Obstetrics and Gynecology

## 2022-10-21 DIAGNOSIS — E041 Nontoxic single thyroid nodule: Secondary | ICD-10-CM

## 2022-12-30 ENCOUNTER — Ambulatory Visit: Payer: Commercial Managed Care - PPO | Admitting: Family Medicine

## 2022-12-30 ENCOUNTER — Encounter: Payer: Self-pay | Admitting: Family Medicine

## 2022-12-30 VITALS — BP 99/65 | HR 72 | Temp 97.7°F | Ht 59.75 in | Wt 138.8 lb

## 2022-12-30 DIAGNOSIS — E119 Type 2 diabetes mellitus without complications: Secondary | ICD-10-CM

## 2022-12-30 DIAGNOSIS — E7800 Pure hypercholesterolemia, unspecified: Secondary | ICD-10-CM

## 2022-12-30 DIAGNOSIS — G5603 Carpal tunnel syndrome, bilateral upper limbs: Secondary | ICD-10-CM | POA: Diagnosis not present

## 2022-12-30 LAB — POCT GLYCOSYLATED HEMOGLOBIN (HGB A1C)
HbA1c POC (<> result, manual entry): 6.3 % (ref 4.0–5.6)
HbA1c, POC (controlled diabetic range): 6.3 % (ref 0.0–7.0)
HbA1c, POC (prediabetic range): 6.3 % (ref 5.7–6.4)
Hemoglobin A1C: 6.3 % — AB (ref 4.0–5.6)

## 2022-12-30 LAB — COMPREHENSIVE METABOLIC PANEL
ALT: 18 U/L (ref 0–35)
AST: 17 U/L (ref 0–37)
Albumin: 4.5 g/dL (ref 3.5–5.2)
Alkaline Phosphatase: 68 U/L (ref 39–117)
BUN: 22 mg/dL (ref 6–23)
CO2: 30 mEq/L (ref 19–32)
Calcium: 9.8 mg/dL (ref 8.4–10.5)
Chloride: 104 mEq/L (ref 96–112)
Creatinine, Ser: 0.76 mg/dL (ref 0.40–1.20)
GFR: 89.23 mL/min (ref >60.00)
Glucose, Bld: 96 mg/dL (ref 70–99)
Potassium: 4.3 mEq/L (ref 3.5–5.1)
Sodium: 142 mEq/L (ref 135–145)
Total Bilirubin: 0.5 mg/dL (ref 0.2–1.2)
Total Protein: 7.4 g/dL (ref 6.0–8.3)

## 2022-12-30 LAB — LIPID PANEL
Cholesterol: 104 mg/dL (ref 0–200)
HDL: 59.3 mg/dL (ref >39.00)
LDL Cholesterol: 36 mg/dL (ref 0–99)
NonHDL: 44.77
Total CHOL/HDL Ratio: 2
Triglycerides: 45 mg/dL (ref 0.0–149.0)
VLDL: 9 mg/dL (ref 0.0–40.0)

## 2022-12-30 NOTE — Progress Notes (Signed)
OFFICE VISIT  12/30/2022  CC:  Chief Complaint  Patient presents with   Medical Management of Chronic Issues    Pt is fasting    Patient is a 52 y.o. female who presents for 56-month follow-up diabetes and hypercholesterolemia. A/P as of last visit: "1 health maintenance exam: Reviewed age and gender appropriate health maintenance issues (prudent diet, regular exercise, health risks of tobacco and excessive alcohol, use of seatbelts, fire alarms in home, use of sunscreen).  Also reviewed age and gender appropriate health screening as well as vaccine recommendations. Vaccines: Flu->UTD. Otherwise all utd. Labs: fasting HP, Hba1c. Cervical ca screening: per pt's GYN. Breast ca screening: per pt's GYN. Colon ca screening: recall 2031.   #2 type 2 diabetes without complication. Historically well controlled on Jardiance 10 mg a day.  Diet is good. Feet exam normal today.  Eye exam and urine microalbumin/creatinine screening up-to-date. Hemoglobin A1c and fasting glucose today.   #3 hyperlipidemia. LDL was 110 when checked 6 months ago on atorvastatin 80 mg a day. Added Zetia but patient says she forgot to pick this up. Lipid panel and hepatic panel today. Sent Zetia in again.   #4 sicca syndrome. Checking Sjogren's antibodies today.   #5 carpal tunnel syndrome on the left. Recommended wrist splints for nighttime use, activity modification as well."  INTERIM HX: Overall Jayel is doing well.  Morning sugars anywhere from 100-140, but usually more towards 100. Not exercising because the winter weather. Feels like her meds are causing a little bit of upset stomach and headache but says symptoms are mild. Also still having tingling and discomfort in the wrist and hands in the median nerve distribution.  Worse at night and in the morning.  She did not get the splint I recommended last visit.  ROS as above, plus--> no fevers, no CP, no SOB, no wheezing, no cough, no dizziness, no  rashes, no melena/hematochezia.  No polyuria or polydipsia.  No myalgias or arthralgias.  No focal weakness, paresthesias, or tremors.  No acute vision or hearing abnormalities.  No dysuria or unusual/new urinary urgency or frequency.  No recent changes in lower legs. No n/v/d or abd pain.  No palpitations.     Past Medical History:  Diagnosis Date   Allergic rhinitis    Environmental allergens--multiple.  Patient to start immunotherapy 2023   Diabetes mellitus ([facility]) 05/2018   Hba1c 6.5%   Diverticulosis 06/2021   colonoscopy   Food allergy    peanut, cucumber, mushroom, orange, sesame seed--question of?--but all food allergy testing neg 2023 at allergist   Gestational diabetes    Both pregnancies.   Hyperlipemia, mixed 05/2018   Great response to statin   Left carpal tunnel syndrome    Migraines    Multiple thyroid nodules    FNA of one nodule: benign follic nodule/cyst on [id number].  f/u u/s 08/2019 stable nodules, no criteria for bx.  Rpt 01/2021->2 new nodules, need f/u u/s 1 yr.  Annual repeat rec'd until stable for 5 yrs (Dr. Radene Knee).   Ureterolithiasis    R Ureteroscopy with holmium laser 08/05/19. CaOx. Right sided ESWL 11/2021    Past Surgical History:  Procedure Laterality Date   BIOPSY THYROID Right 11/15/2017   benign.     CESAREAN SECTION  08/03/2011   Procedure: CESAREAN SECTION;  Surgeon: Margarette Asal;  Location: [facility];  Service: Gynecology;  Laterality: N/A;   COLONOSCOPY  06/29/2020   no adenomas. Recall 2031   CYSTOSCOPY WITH RETROGRADE PYELOGRAM,  URETEROSCOPY AND STENT PLACEMENT Right 08/05/2019   Procedure: CYSTOSCOPY WITH RETROGRADE PYELOGRAM, URETEROSCOPY AND STENT PLACEMENTwith laser;  Surgeon: Franchot Gallo, MD;  Location: [facility];  Service: Urology;  Laterality: Right;  1 HR   EXTRACORPOREAL SHOCK WAVE LITHOTRIPSY Right 11/25/2021   Procedure: EXTRACORPOREAL SHOCK WAVE LITHOTRIPSY (ESWL);  Surgeon: Robley Fries, MD;  Location: [facility];  Service: Urology;  Laterality: Right;   HOLMIUM LASER APPLICATION Right Q000111Q   Procedure: HOLMIUM LASER APPLICATION;  Surgeon: Franchot Gallo, MD;  Location: [facility];  Service: Urology;  Laterality: Right;   [place] EXTRACTION  2000    Outpatient Medications Prior to Visit  Medication Sig Dispense Refill   atorvastatin (LIPITOR) 80 MG tablet Take 1 tablet (80 mg total) by mouth daily. 90 tablet 3   empagliflozin (JARDIANCE) 10 MG TABS tablet Take 1 tablet (10 mg total) by mouth daily. 90 tablet 3   ezetimibe (ZETIA) 10 MG tablet Take 1 tablet (10 mg total) by mouth daily. 90 tablet 1   pioglitazone (ACTOS) 15 MG tablet TAKE ONE TABLET BY MOUTH DAILY 30 tablet 2   hydrocortisone 2.5 % cream Apply 1 Application topically 2 (two) times daily. (Patient not taking: Reported on 12/30/2022)     No facility-administered medications prior to visit.    Allergies  Allergen Reactions   Metformin And Related Other (See Comments)    UPSET STOMACH   Cucumber Extract Rash   Mushroom Extract Complex Rash   Peanut-Containing Drug Products Rash   Soy Allergy Rash    Review of Systems As per HPI  PE:    12/30/2022    7:56 AM 08/18/2022    8:16 AM 02/09/2022    9:17 AM  Vitals with BMI  Height 4' 11.75" 4' 11.75" 5\' 0"   Weight 138 lbs 13 oz 126 lbs 13 oz 127 lbs 6 oz  BMI 27.32 [id number]  Systolic 99 [id number]  Diastolic 65 72 73  Pulse 72 73 69     Physical Exam  Gen: Alert, well appearing.  Patient is oriented to person, place, time, and situation. AFFECT: pleasant, lucid thought and speech. CV: RRR, no m/r/g.   LUNGS: CTA bilat, nonlabored resps, good aeration in all lung fields. EXT: no clubbing or cyanosis.  no edema.  Wrists: Positive [place] bilaterally, positive [place] bilaterally.  No muscle wasting.  Normal sensation to fine touch.  LABS:  Last CBC Lab Results  Component Value Date   WBC 5.1 08/18/2022   HGB 14.2 08/18/2022   HCT 42.8 08/18/2022    MCV 90.4 08/18/2022   MCH 29.5 08/02/2019   RDW 13.4 08/18/2022   PLT 242.0 123456   Last metabolic panel Lab Results  Component Value Date   GLUCOSE 99 08/18/2022   NA 140 08/18/2022   K 3.9 08/18/2022   CL 106 08/18/2022   CO2 28 08/18/2022   BUN 16 08/18/2022   CREATININE 0.58 08/18/2022   GFRNONAA >60 08/02/2019   CALCIUM 9.5 08/18/2022   PROT 7.3 08/18/2022   ALBUMIN 4.3 08/18/2022   BILITOT 0.6 08/18/2022   ALKPHOS 70 08/18/2022   AST 18 08/18/2022   ALT 28 08/18/2022   ANIONGAP 9 08/02/2019   Last lipids Lab Results  Component Value Date   CHOL 170 08/18/2022   HDL 59.60 08/18/2022   LDLCALC 93 08/18/2022   TRIG 88.0 08/18/2022   CHOLHDL 3 08/18/2022   Last hemoglobin A1c Lab Results  Component Value Date   HGBA1C  6.3 (A) 12/30/2022   HGBA1C 6.3 12/30/2022   HGBA1C 6.3 12/30/2022   HGBA1C 6.3 12/30/2022   Last thyroid functions Lab Results  Component Value Date   TSH 1.10 08/18/2022   T3TOTAL 100 10/15/2021   IMPRESSION AND PLAN:  #1 diabetes without complication. Doing great.  Point-of-care hemoglobin A1c today is 6.3%. Continue Actos 15 mg a day and Jardiance 10 mg a day.  2.  Hypercholesterolemia. Continue atorvastatin 80 mg a day and Zetia 10 mg a day. Recheck lipids and metabolic panel today.  #3 bilateral carpal tunnel syndrome. Reiterated the importance of wearing splints at night to see if this responds. Symptoms are minimally bothersome at this time but if they progress she knows the next possible step is steroid injection.  An After Visit Summary was printed and given to the patient.  FOLLOW UP: Return in about 3 months (around 04/01/2023) for routine chronic illness f/u. Next cpe 08/2023 Signed:  [name], MD           12/30/2022

## 2023-01-06 ENCOUNTER — Other Ambulatory Visit: Payer: Self-pay | Admitting: Family Medicine

## 2023-04-11 ENCOUNTER — Ambulatory Visit: Payer: Commercial Managed Care - PPO | Admitting: Family Medicine

## 2023-04-11 LAB — HM DIABETES EYE EXAM

## 2023-04-17 NOTE — Patient Instructions (Signed)

## 2023-04-20 ENCOUNTER — Ambulatory Visit: Payer: Commercial Managed Care - PPO | Admitting: Family Medicine

## 2023-04-20 ENCOUNTER — Encounter: Payer: Self-pay | Admitting: Family Medicine

## 2023-04-20 VITALS — BP 106/71 | HR 69 | Temp 97.9°F | Ht 59.75 in | Wt 139.2 lb

## 2023-04-20 DIAGNOSIS — E119 Type 2 diabetes mellitus without complications: Secondary | ICD-10-CM

## 2023-04-20 DIAGNOSIS — Z7984 Long term (current) use of oral hypoglycemic drugs: Secondary | ICD-10-CM | POA: Diagnosis not present

## 2023-04-20 LAB — POCT GLYCOSYLATED HEMOGLOBIN (HGB A1C)
HbA1c POC (<> result, manual entry): 6.4 % (ref 4.0–5.6)
HbA1c, POC (controlled diabetic range): 6.4 % (ref 0.0–7.0)
HbA1c, POC (prediabetic range): 6.4 % (ref 5.7–6.4)
Hemoglobin A1C: 6.4 % — AB (ref 4.0–5.6)

## 2023-04-20 LAB — MICROALBUMIN / CREATININE URINE RATIO
Creatinine,U: 40.7 mg/dL
Microalb Creat Ratio: 1.7 mg/g (ref 0.0–30.0)
Microalb, Ur: <0.7 mg/dL (ref 0.0–1.9)

## 2023-04-20 NOTE — Progress Notes (Signed)
OFFICE VISIT  04/20/2023  CC:  Chief Complaint  Patient presents with   Follow-up    3 month Rci    Patient is a 52 y.o. female who presents for 4 mo f/u DM and HLD. A/P as of last visit: "#1 diabetes without complication. Doing great.  Point-of-care hemoglobin A1c today is 6.3%. Continue Actos 15 mg a day and Jardiance 10 mg a day.   2.  Hypercholesterolemia. Continue atorvastatin 80 mg a day and Zetia 10 mg a day. Recheck lipids and metabolic panel today.   #3 bilateral carpal tunnel syndrome. Reiterated the importance of wearing splints at night to see if this responds. Symptoms are minimally bothersome at this time but if they progress she knows the next possible step is steroid injection."  INTERIM HX: Tiffany Hicks feels well although she did strain hamstring recently.  This has limited her ability to exercise vigorously.  She sees an orthopedic doctor tomorrow. Home glucose is around 110.  She does not check it all that often. She admits that she has some improvement to do with her diet. He is frustrated with her weight.  ROS:  no fevers, no CP, no SOB, no wheezing, no cough, no dizziness, no HAs, no rashes, no melena/hematochezia.  No polyuria or polydipsia.  No myalgias or arthralgias.  No focal weakness, paresthesias, or tremors.  No acute vision or hearing abnormalities.  No dysuria or unusual/new urinary urgency or frequency.  No recent changes in lower legs. No n/v/d or abd pain.  No palpitations.    Past Medical History:  Diagnosis Date   Allergic rhinitis    Environmental allergens--multiple.  Patient to start immunotherapy 2023   Diabetes mellitus ([facility]) 05/2018   Hba1c 6.5%   Diverticulosis 06/2021   colonoscopy   Food allergy    peanut, cucumber, mushroom, orange, sesame seed--question of?--but all food allergy testing neg 2023 at allergist   Gestational diabetes    Both pregnancies.   Hyperlipemia, mixed 05/2018   Great response to statin   Left carpal tunnel  syndrome    Migraines    Multiple thyroid nodules    FNA of one nodule: benign follic nodule/cyst on 11/15/17.  f/u u/s 08/2019 stable nodules, no criteria for bx.  Rpt 01/2021->2 new nodules, need f/u u/s 1 yr.  Annual repeat rec'd until stable for 5 yrs (Dr. Arelia Sneddon).   Ureterolithiasis    R Ureteroscopy with holmium laser 08/05/19. CaOx. Right sided ESWL 11/2021    Past Surgical History:  Procedure Laterality Date   BIOPSY THYROID Right 11/15/2017   benign.     CESAREAN SECTION  08/03/2011   Procedure: CESAREAN SECTION;  Surgeon: Meriel Pica;  Location: [facility];  Service: Gynecology;  Laterality: N/A;   COLONOSCOPY  06/29/2020   no adenomas. Recall 2031   CYSTOSCOPY WITH RETROGRADE PYELOGRAM, URETEROSCOPY AND STENT PLACEMENT Right 08/05/2019   Procedure: CYSTOSCOPY WITH RETROGRADE PYELOGRAM, URETEROSCOPY AND STENT PLACEMENTwith laser;  Surgeon: Marcine Matar, MD;  Location: [facility];  Service: Urology;  Laterality: Right;  1 HR   EXTRACORPOREAL SHOCK WAVE LITHOTRIPSY Right 11/25/2021   Procedure: EXTRACORPOREAL SHOCK WAVE LITHOTRIPSY (ESWL);  Surgeon: Noel Christmas, MD;  Location: [facility];  Service: Urology;  Laterality: Right;   HOLMIUM LASER APPLICATION Right 08/05/2019   Procedure: HOLMIUM LASER APPLICATION;  Surgeon: Marcine Matar, MD;  Location: [facility];  Service: Urology;  Laterality: Right;   WISDOM TOOTH EXTRACTION  2000    Outpatient Medications Prior to Visit  Medication Sig Dispense Refill   atorvastatin (LIPITOR) 80 MG tablet Take 1 tablet (80 mg total) by mouth daily. 90 tablet 3   empagliflozin (JARDIANCE) 10 MG TABS tablet Take 1 tablet (10 mg total) by mouth daily. 90 tablet 3   ezetimibe (ZETIA) 10 MG tablet Take 1 tablet (10 mg total) by mouth daily. 90 tablet 1   pioglitazone (ACTOS) 15 MG tablet TAKE ONE TABLET BY MOUTH EVERY DAY 90 tablet 1   hydrocortisone 2.5 % cream Apply 1 Application topically 2 (two) times daily. (Patient not  taking: Reported on 12/30/2022)     No facility-administered medications prior to visit.    Allergies  Allergen Reactions   Metformin And Related Other (See Comments)    UPSET STOMACH   Cucumber Extract Rash   Mushroom Extract Complex Rash   Peanut-Containing Drug Products Rash   Soy Allergy Rash    Review of Systems As per HPI  PE:    04/20/2023    8:05 AM 12/30/2022    7:56 AM 08/18/2022    8:16 AM  Vitals with BMI  Height  4' 11.75" 4' 11.75"  Weight 139 lbs 3 oz 138 lbs 13 oz 126 lbs 13 oz  BMI  27.32 24.96  Systolic 106 99 108  Diastolic 71 65 72  Pulse 69 72 73     Physical Exam  Gen: Alert, well appearing.  Patient is oriented to person, place, time, and situation. AFFECT: pleasant, lucid thought and speech. No further exam today  LABS:  Last CBC Lab Results  Component Value Date   WBC 5.1 08/18/2022   HGB 14.2 08/18/2022   HCT 42.8 08/18/2022   MCV 90.4 08/18/2022   MCH 29.5 08/02/2019   RDW 13.4 08/18/2022   PLT 242.0 08/18/2022   Last metabolic panel Lab Results  Component Value Date   GLUCOSE 96 12/30/2022   NA 142 12/30/2022   K 4.3 12/30/2022   CL 104 12/30/2022   CO2 30 12/30/2022   BUN 22 12/30/2022   CREATININE 0.76 12/30/2022   GFR 89.23 12/30/2022   CALCIUM 9.8 12/30/2022   PROT 7.4 12/30/2022   ALBUMIN 4.5 12/30/2022   BILITOT 0.5 12/30/2022   ALKPHOS 68 12/30/2022   AST 17 12/30/2022   ALT 18 12/30/2022   ANIONGAP 9 08/02/2019   Last lipids Lab Results  Component Value Date   CHOL 104 12/30/2022   HDL 59.30 12/30/2022   LDLCALC 36 12/30/2022   TRIG 45.0 12/30/2022   CHOLHDL 2 12/30/2022   Last hemoglobin A1c Lab Results  Component Value Date   HGBA1C 6.4 (A) 04/20/2023   HGBA1C 6.4 04/20/2023   HGBA1C 6.4 04/20/2023   HGBA1C 6.4 04/20/2023   Last thyroid functions Lab Results  Component Value Date   TSH 1.10 08/18/2022   T3TOTAL 100 10/15/2021   Last vitamin B12 and Folate Lab Results  Component Value  Date   VITAMINB12 473 10/15/2021   IMPRESSION AND PLAN:  #1 diabetes without complication, doing well on Jardiance 10 mg a day and pioglitazone 15 mg a day. Point-of-care hemoglobin A1c today is 6.4%. Creatinine and electrolytes have consistently been normal over the years so we will wait until 48-month follow-up to recheck these.  #2 hypercholesterolemia. Doing great on atorvastatin 80 mg a day and Zetia 10 mg a day. LDL was 36 about 4 months ago. Plan repeat lipid panel in 3 months.  #3 overweight, BMI 27. Discussed the basics of TLC today.  She expressed an  interest in GLP-1 agonist but I recommended we hold off at this time.  An After Visit Summary was printed and given to the patient.  FOLLOW UP: Return in about 3 months (around 07/21/2023) for routine chronic illness f/u.  Signed:  Santiago Bumpers, MD           04/20/2023

## 2023-07-05 ENCOUNTER — Other Ambulatory Visit: Payer: Self-pay | Admitting: Family Medicine

## 2023-08-04 ENCOUNTER — Other Ambulatory Visit: Payer: Self-pay | Admitting: Family Medicine

## 2023-08-07 ENCOUNTER — Telehealth: Payer: Self-pay | Admitting: Family Medicine

## 2023-08-07 NOTE — Telephone Encounter (Signed)
The phone number to reach April on is [phone] at [facility]

## 2023-08-07 NOTE — Telephone Encounter (Signed)
Spoke with pharmacy tech, pt has upcoming appt in 2 days. Last refills 08/18/22 for 1 year supply. Will refill during appt

## 2023-08-07 NOTE — Patient Instructions (Signed)

## 2023-08-07 NOTE — Telephone Encounter (Signed)
Please give April a call at [facility] pharmacy to discuss why patients refills are being denied for atorvastatin (LIPITOR) 80 MG tablet , ezetimibe (ZETIA) 10 MG tablet, pioglitazone (ACTOS) 15 MG tablet

## 2023-08-09 ENCOUNTER — Ambulatory Visit: Payer: Commercial Managed Care - PPO | Admitting: Family Medicine

## 2023-08-09 ENCOUNTER — Encounter: Payer: Self-pay | Admitting: Family Medicine

## 2023-08-09 ENCOUNTER — Telehealth: Payer: Self-pay

## 2023-08-09 VITALS — BP 116/81 | HR 80 | Ht 61.0 in | Wt 144.0 lb

## 2023-08-09 DIAGNOSIS — E119 Type 2 diabetes mellitus without complications: Secondary | ICD-10-CM

## 2023-08-09 DIAGNOSIS — Z7984 Long term (current) use of oral hypoglycemic drugs: Secondary | ICD-10-CM

## 2023-08-09 DIAGNOSIS — Z23 Encounter for immunization: Secondary | ICD-10-CM | POA: Diagnosis not present

## 2023-08-09 DIAGNOSIS — E7800 Pure hypercholesterolemia, unspecified: Secondary | ICD-10-CM

## 2023-08-09 DIAGNOSIS — Z0000 Encounter for general adult medical examination without abnormal findings: Secondary | ICD-10-CM

## 2023-08-09 LAB — LIPID PANEL
Cholesterol: 177 mg/dL (ref 0–200)
HDL: 54.2 mg/dL (ref >39.00)
LDL Cholesterol: 108 mg/dL — ABNORMAL HIGH (ref 0–99)
NonHDL: 122.48
Total CHOL/HDL Ratio: 3
Triglycerides: 70 mg/dL (ref 0.0–149.0)
VLDL: 14 mg/dL (ref 0.0–40.0)

## 2023-08-09 LAB — COMPREHENSIVE METABOLIC PANEL
ALT: 39 U/L — ABNORMAL HIGH (ref 0–35)
AST: 25 U/L (ref 0–37)
Albumin: 4.2 g/dL (ref 3.5–5.2)
Alkaline Phosphatase: 86 U/L (ref 39–117)
BUN: 20 mg/dL (ref 6–23)
CO2: 30 meq/L (ref 19–32)
Calcium: 9.5 mg/dL (ref 8.4–10.5)
Chloride: 106 meq/L (ref 96–112)
Creatinine, Ser: 0.64 mg/dL (ref 0.40–1.20)
GFR: 100.21 mL/min (ref >60.00)
Glucose, Bld: 108 mg/dL — ABNORMAL HIGH (ref 70–99)
Potassium: 4.3 meq/L (ref 3.5–5.1)
Sodium: 142 meq/L (ref 135–145)
Total Bilirubin: 0.6 mg/dL (ref 0.2–1.2)
Total Protein: 7.4 g/dL (ref 6.0–8.3)

## 2023-08-09 LAB — CBC WITH DIFFERENTIAL/PLATELET
Basophils Absolute: 0 10*3/uL (ref 0.0–0.1)
Basophils Relative: 0.8 % (ref 0.0–3.0)
Eosinophils Absolute: 0.2 10*3/uL (ref 0.0–0.7)
Eosinophils Relative: 2.8 % (ref 0.0–5.0)
HCT: 44.3 % (ref 36.0–46.0)
Hemoglobin: 14.1 g/dL (ref 12.0–15.0)
Lymphocytes Relative: 34.6 % (ref 12.0–46.0)
Lymphs Abs: 2 10*3/uL (ref 0.7–4.0)
MCHC: 31.8 g/dL (ref 30.0–36.0)
MCV: 93.9 fL (ref 78.0–100.0)
Monocytes Absolute: 0.4 10*3/uL (ref 0.1–1.0)
Monocytes Relative: 6.1 % (ref 3.0–12.0)
Neutro Abs: 3.2 10*3/uL (ref 1.4–7.7)
Neutrophils Relative %: 55.7 % (ref 43.0–77.0)
Platelets: 262 10*3/uL (ref 150.0–400.0)
RBC: 4.72 Mil/uL (ref 3.87–5.11)
RDW: 14.1 % (ref 11.5–15.5)
WBC: 5.7 10*3/uL (ref 4.0–10.5)

## 2023-08-09 LAB — POCT GLYCOSYLATED HEMOGLOBIN (HGB A1C)
HbA1c POC (<> result, manual entry): 6.7 % (ref 4.0–5.6)
HbA1c, POC (controlled diabetic range): 6.7 % (ref 0.0–7.0)
HbA1c, POC (prediabetic range): 6.7 % — AB (ref 5.7–6.4)
Hemoglobin A1C: 6.7 % — AB (ref 4.0–5.6)

## 2023-08-09 LAB — TSH: TSH: 0.98 u[IU]/mL (ref 0.35–5.50)

## 2023-08-09 MED ORDER — ATORVASTATIN CALCIUM 80 MG PO TABS: 80.0000 mg | ORAL_TABLET | Freq: Every day | ORAL | 1 refills | Status: DC

## 2023-08-09 MED ORDER — EZETIMIBE 10 MG PO TABS: 10.0000 mg | ORAL_TABLET | Freq: Every day | ORAL | 1 refills | Status: DC

## 2023-08-09 MED ORDER — PIOGLITAZONE HCL 15 MG PO TABS: 15.0000 mg | ORAL_TABLET | Freq: Every day | ORAL | 1 refills | Status: DC

## 2023-08-09 MED ORDER — EMPAGLIFLOZIN 10 MG PO TABS: 10.0000 mg | ORAL_TABLET | Freq: Every day | ORAL | 1 refills | Status: DC

## 2023-08-09 NOTE — Telephone Encounter (Signed)
Pt had CPE today, 10/30. Pt brought physical exam form to be filled out and signed by the provider. Waiting for lab results and then will place on PCP desk to sign .

## 2023-08-09 NOTE — Progress Notes (Signed)
Office Note 08/09/2023  CC:  Chief Complaint  Patient presents with   Annual Exam    Pt is fasting.    Patient is a 52 y.o. female who is here for annual health maintenance exam and 58-month follow-up diabetes and hypercholesterolemia. A/P as of last visit: "#1 diabetes without complication, doing well on Jardiance 10 mg a day and pioglitazone 15 mg a day. Point-of-care hemoglobin A1c today is 6.4%. Creatinine and electrolytes have consistently been normal over the years so we will wait until 9-month follow-up to recheck these.   #2 hypercholesterolemia. Doing great on atorvastatin 80 mg a day and Zetia 10 mg a day. LDL was 36 about 4 months ago. Plan repeat lipid panel in 3 months.   #3 overweight, BMI 27. Discussed the basics of TLC today.  She expressed an interest in GLP-1 agonist but I recommended we hold off at this time."  INTERIM HX: Tiffany Hicks feels well.  She admits she has some work to do regarding healthier diet and increased exercise.  Past Medical History:  Diagnosis Date   Allergic rhinitis    Environmental allergens--multiple.  Patient to start immunotherapy 2023   Diabetes mellitus (HCC) 05/2018   Hba1c 6.5%   Diverticulosis 06/2021   colonoscopy   Food allergy    peanut, cucumber, mushroom, orange, sesame seed--question of?--but all food allergy testing neg 2023 at allergist   Gestational diabetes    Both pregnancies.   Hyperlipemia, mixed 05/2018   Great response to statin   Left carpal tunnel syndrome    Migraines    Multiple thyroid nodules    FNA of one nodule: benign follic nodule/cyst on 11/15/17.  f/u u/s 08/2019 stable nodules, no criteria for bx.  Rpt 01/2021->2 new nodules, need f/u u/s 1 yr.  Annual repeat rec'd until stable for 5 yrs (Dr. Arelia Sneddon).   Ureterolithiasis    R Ureteroscopy with holmium laser 08/05/19. CaOx. Right sided ESWL 11/2021    Past Surgical History:  Procedure Laterality Date   BIOPSY THYROID Right 11/15/2017   benign.      CESAREAN SECTION  08/03/2011   Procedure: CESAREAN SECTION;  Surgeon: Meriel Pica;  Location: [facility];  Service: Gynecology;  Laterality: N/A;   COLONOSCOPY  06/29/2020   no adenomas. Recall 2031   CYSTOSCOPY WITH RETROGRADE PYELOGRAM, URETEROSCOPY AND STENT PLACEMENT Right 08/05/2019   Procedure: CYSTOSCOPY WITH RETROGRADE PYELOGRAM, URETEROSCOPY AND STENT PLACEMENTwith laser;  Surgeon: Marcine Matar, MD;  Location: [facility];  Service: Urology;  Laterality: Right;  1 HR   EXTRACORPOREAL SHOCK WAVE LITHOTRIPSY Right 11/25/2021   Procedure: EXTRACORPOREAL SHOCK WAVE LITHOTRIPSY (ESWL);  Surgeon: Noel Christmas, MD;  Location: [facility];  Service: Urology;  Laterality: Right;   HOLMIUM LASER APPLICATION Right 08/05/2019   Procedure: HOLMIUM LASER APPLICATION;  Surgeon: Marcine Matar, MD;  Location: [facility];  Service: Urology;  Laterality: Right;   WISDOM TOOTH EXTRACTION  2000    Family History  Problem Relation Age of Onset   Hypertension Mother    Diabetes Mother    Lung cancer Mother        stage 4, non-smoker   Hypertension Father    Hyperlipidemia Father    Diabetes Sister    Hyperlipidemia Brother    Diabetes Brother    Diabetes Brother    Hyperlipidemia Brother    Colon polyps Neg Hx    Colon cancer Neg Hx    Esophageal cancer Neg Hx    Stomach cancer Neg  Hx    Rectal cancer Neg Hx     Social History   Socioeconomic History   Marital status: Married    Spouse name: Not on file   Number of children: Not on file   Years of education: Not on file   Highest education level: Master's degree (e.g., MA, MS, MEng, MEd, MSW, MBA)  Occupational History   Not on file  Tobacco Use   Smoking status: Never   Smokeless tobacco: Never  Vaping Use   Vaping status: Never Used  Substance and Sexual Activity   Alcohol use: No   Drug use: No   Sexual activity: Yes    Birth control/protection: None  Other Topics Concern   Not on file  Social  History Narrative   Married, 2 children.   Lives in [place].   Educ: Masters   Water quality scientist at [employer]   Social Determinants of Health   Financial Resource Strain: Low Risk  (08/06/2023)   Overall Financial Resource Strain (CARDIA)    Difficulty of Paying Living Expenses: Not hard at all  Food Insecurity: No Food Insecurity (08/06/2023)   Hunger Vital Sign    Worried About Running Out of Food in the Last Year: Never true    Ran Out of Food in the Last Year: Never true  Transportation Needs: No Transportation Needs (08/06/2023)   PRAPARE - Administrator, Civil Service (Medical): No    Lack of Transportation (Non-Medical): No  Physical Activity: Insufficiently Active (08/06/2023)   Exercise Vital Sign    Days of Exercise per Week: 5 days    Minutes of Exercise per Session: 10 min  Stress: No Stress Concern Present (08/06/2023)   [employer] of Occupational Health - Occupational Stress Questionnaire    Feeling of Stress : Only a little  Social Connections: Unknown (08/06/2023)   Social Connection and Isolation Panel [NHANES]    Frequency of Communication with Friends and Family: More than three times a week    Frequency of Social Gatherings with Friends and Family: More than three times a week    Attends Religious Services: Patient declined    Database administrator or Organizations: No    Attends Engineer, structural: Not on file    Marital Status: Married  Intimate Partner Violence: Unknown (01/12/2022)   Received from [employer], [facility]   HITS    Physically Hurt: Not on file    Insult or Talk Down To: Not on file    Threaten Physical Harm: Not on file    Scream or Curse: Not on file    Outpatient Medications Prior to Visit  Medication Sig Dispense Refill   atorvastatin (LIPITOR) 80 MG tablet Take 1 tablet (80 mg total) by mouth daily. 90 tablet 3   empagliflozin (JARDIANCE) 10 MG TABS tablet Take 1 tablet (10 mg total) by mouth  daily. 90 tablet 3   ezetimibe (ZETIA) 10 MG tablet Take 1 tablet (10 mg total) by mouth daily. 90 tablet 1   hydrocortisone 2.5 % cream Apply 1 Application topically 2 (two) times daily. (Patient not taking: Reported on 12/30/2022)     pioglitazone (ACTOS) 15 MG tablet TAKE ONE TABLET BY MOUTH EVERY DAY 90 tablet 1   No facility-administered medications prior to visit.    Allergies  Allergen Reactions   Metformin And Related Other (See Comments)    UPSET STOMACH   Cucumber Extract Rash   Mushroom Extract Complex Rash  Peanut-Containing Drug Products Rash   Soy Allergy Rash    Review of Systems  Constitutional:  Negative for appetite change, chills, fatigue and fever.  HENT:  Negative for congestion, dental problem, ear pain and sore throat.   Eyes:  Negative for discharge, redness and visual disturbance.  Respiratory:  Negative for cough, chest tightness, shortness of breath and wheezing.   Cardiovascular:  Negative for chest pain, palpitations and leg swelling.  Gastrointestinal:  Negative for abdominal pain, blood in stool, diarrhea, nausea and vomiting.  Genitourinary:  Negative for difficulty urinating, dysuria, flank pain, frequency, hematuria and urgency.  Musculoskeletal:  Negative for arthralgias, back pain, joint swelling, myalgias and neck stiffness.  Skin:  Negative for pallor and rash.  Neurological:  Negative for dizziness, speech difficulty, weakness and headaches.  Hematological:  Negative for adenopathy. Does not bruise/bleed easily.  Psychiatric/Behavioral:  Negative for confusion and sleep disturbance. The patient is not nervous/anxious.     PE;    08/09/2023    8:05 AM 04/20/2023    8:05 AM 12/30/2022    7:56 AM  Vitals with BMI  Height 5\' 1"  4' 11.75" 4' 11.75"  Weight 144 lbs 139 lbs 3 oz 138 lbs 13 oz  BMI 27.22 27.4 27.32  Systolic 116 106 99  Diastolic 81 71 65  Pulse 80 69 72    Exam chaperoned by [employer], CMA Gen: Alert, well appearing.   Patient is oriented to person, place, time, and situation. AFFECT: pleasant, lucid thought and speech. ENT: Ears: EACs clear, normal epithelium.  TMs with good light reflex and landmarks bilaterally.  Eyes: no injection, icteris, swelling, or exudate.  EOMI, PERRLA. Nose: no drainage or turbinate edema/swelling.  No injection or focal lesion.  Mouth: lips without lesion/swelling.  Oral mucosa pink and moist.  Dentition intact and without obvious caries or gingival swelling.  Oropharynx without erythema, exudate, or swelling.  Neck: supple/nontender.  No LAD, mass, or TM.  Carotid pulses 2+ bilaterally, without bruits. CV: RRR, no m/r/g.   LUNGS: CTA bilat, nonlabored resps, good aeration in all lung fields. ABD: soft, NT, ND, BS normal.  No hepatospenomegaly or mass.  No bruits. EXT: no clubbing, cyanosis, or edema.  Musculoskeletal: no joint swelling, erythema, warmth, or tenderness.  ROM of all joints intact. Skin - no sores or suspicious lesions or rashes or color changes  Pertinent labs:  Lab Results  Component Value Date   TSH 1.10 08/18/2022   Lab Results  Component Value Date   WBC 5.1 08/18/2022   HGB 14.2 08/18/2022   HCT 42.8 08/18/2022   MCV 90.4 08/18/2022   PLT 242.0 08/18/2022   Lab Results  Component Value Date   CREATININE 0.76 12/30/2022   BUN 22 12/30/2022   NA 142 12/30/2022   K 4.3 12/30/2022   CL 104 12/30/2022   CO2 30 12/30/2022   Lab Results  Component Value Date   ALT 18 12/30/2022   AST 17 12/30/2022   ALKPHOS 68 12/30/2022   BILITOT 0.5 12/30/2022   Lab Results  Component Value Date   CHOL 104 12/30/2022   Lab Results  Component Value Date   HDL 59.30 12/30/2022   Lab Results  Component Value Date   LDLCALC 36 12/30/2022   Lab Results  Component Value Date   TRIG 45.0 12/30/2022   Lab Results  Component Value Date   CHOLHDL 2 12/30/2022   Lab Results  Component Value Date   HGBA1C 6.7 (A) 08/09/2023  HGBA1C 6.7 08/09/2023    HGBA1C 6.7 (A) 08/09/2023   HGBA1C 6.7 08/09/2023   ASSESSMENT AND PLAN:   1) Health maintenance exam: Reviewed age and gender appropriate health maintenance issues (prudent diet, regular exercise, health risks of tobacco and excessive alcohol, use of seatbelts, fire alarms in home, use of sunscreen).  Also reviewed age and gender appropriate health screening as well as vaccine recommendations. Vaccines: Flu->given today.  Otherwise all utd. Labs: fasting HP, Hba1c. Cervical ca screening: per pt's GYN. Breast ca screening: per pt's GYN. Colon ca screening: recall 2031.  #2 diabetes without complication, well-controlled . POC Hba1c today is 6.7%. Continue Jardiance 10 mg a day and pioglitazone 15 mg a day. She is motivated to increase exercise and try to improve diabetic diet.  3.  Hypercholesterolemia, doing well long-term on Lipitor 80 mg a day and Zetia 10 mg a day. Lipid panel and hepatic panel today.  #4 chronic right knee pain. She states she is getting a minor surgery by Dr. Eulah Pont in orthopedics soon.  An After Visit Summary was printed and given to the patient.  FOLLOW UP:  Return in about 3 months (around 11/09/2023) for routine chronic illness f/u.  Signed:  Santiago Bumpers, MD           08/09/2023

## 2023-08-10 ENCOUNTER — Encounter: Payer: Self-pay | Admitting: Family Medicine

## 2023-08-11 MED ORDER — FLUTICASONE PROPIONATE 50 MCG/ACT NA SUSP: 2.0000 | Freq: Every day | NASAL | 6 refills | Status: DC

## 2023-08-11 NOTE — Telephone Encounter (Signed)
Dyanne Carrel prescription sent

## 2023-08-11 NOTE — Telephone Encounter (Signed)
Physical Exam for has been signed by PCP and faxed.

## 2023-08-27 ENCOUNTER — Emergency Department (HOSPITAL_BASED_OUTPATIENT_CLINIC_OR_DEPARTMENT_OTHER)
Admission: EM | Admit: 2023-08-27 | Discharge: 2023-08-27 | Disposition: A | Discharge status: Home / Self Care | Payer: Commercial Managed Care - PPO | Attending: Emergency Medicine | Admitting: Emergency Medicine

## 2023-08-27 ENCOUNTER — Encounter (HOSPITAL_BASED_OUTPATIENT_CLINIC_OR_DEPARTMENT_OTHER): Payer: Self-pay | Admitting: Emergency Medicine

## 2023-08-27 ENCOUNTER — Emergency Department (HOSPITAL_BASED_OUTPATIENT_CLINIC_OR_DEPARTMENT_OTHER): Payer: Commercial Managed Care - PPO | Admitting: Radiology

## 2023-08-27 DIAGNOSIS — J181 Lobar pneumonia, unspecified organism: Secondary | ICD-10-CM | POA: Diagnosis not present

## 2023-08-27 DIAGNOSIS — Z91010 Allergy to peanuts: Secondary | ICD-10-CM | POA: Diagnosis not present

## 2023-08-27 DIAGNOSIS — J189 Pneumonia, unspecified organism: Secondary | ICD-10-CM

## 2023-08-27 DIAGNOSIS — R059 Cough, unspecified: Secondary | ICD-10-CM | POA: Diagnosis present

## 2023-08-27 MED ORDER — BENZONATATE 100 MG PO CAPS: 100.0000 mg | ORAL_CAPSULE | Freq: Three times a day (TID) | ORAL | 0 refills | Status: DC

## 2023-08-27 MED ORDER — LEVOFLOXACIN 750 MG PO TABS: 750.0000 mg | ORAL_TABLET | Freq: Every day | ORAL | 0 refills | Status: DC

## 2023-08-27 NOTE — ED Triage Notes (Signed)
Pt c/o cough x 2 weeks. Pt also endorses tachycardia when walking around her house x 1 week pta. Denies CP today

## 2023-08-27 NOTE — ED Provider Notes (Signed)
[place] EMERGENCY DEPARTMENT AT [facility] Provider Note   CSN: [id number] Arrival date & time: 08/27/23  1914     History  Chief Complaint  Patient presents with   Cough    Tiffany Hicks is a 52 y.o. female.  52 year old female presents with several week history of nonproductive cough.  Denies any fever or chills.  No hemoptysis noted.  No reported vomiting or diarrhea.  She has not been short of breath.  No anginal or CHF symptoms.  Thought that it was seasonal allergies and it has been unresponsive to over-the-counter medications.  Her cough is worse at night.  Denies any weight loss or night sweats.  No recent travel history       Home Medications Prior to Admission medications   Medication Sig Start Date End Date Taking? Authorizing Provider  atorvastatin (LIPITOR) 80 MG tablet Take 1 tablet (80 mg total) by mouth daily. 08/09/23   McGowen, Maryjean Morn, MD  empagliflozin (JARDIANCE) 10 MG TABS tablet Take 1 tablet (10 mg total) by mouth daily. 08/09/23   McGowen, Maryjean Morn, MD  ezetimibe (ZETIA) 10 MG tablet Take 1 tablet (10 mg total) by mouth daily. 08/09/23   McGowen, Maryjean Morn, MD  fluticasone (FLONASE) 50 MCG/ACT nasal spray Place 2 sprays into both nostrils daily. 08/11/23   McGowen, Maryjean Morn, MD  pioglitazone (ACTOS) 15 MG tablet Take 1 tablet (15 mg total) by mouth daily. 08/09/23   McGowen, Maryjean Morn, MD      Allergies    Metformin and related, Cucumber extract, Mushroom extract complex, Peanut-containing drug products, and Soy allergy    Review of Systems   Review of Systems  All other systems reviewed and are negative.   Physical Exam Updated Vital Signs Temp 98.4 F (36.9 C) (Oral)   Wt 63.5 kg   BMI 26.45 kg/m  Physical Exam Vitals and nursing note reviewed.  Constitutional:      General: She is not in acute distress.    Appearance: Normal appearance. She is well-developed. She is not toxic-appearing.  HENT:     Head: Normocephalic and  atraumatic.  Eyes:     General: Lids are normal.     Conjunctiva/sclera: Conjunctivae normal.     Pupils: Pupils are equal, round, and reactive to light.  Neck:     Thyroid: No thyroid mass.     Trachea: No tracheal deviation.  Cardiovascular:     Rate and Rhythm: Normal rate and regular rhythm.     Heart sounds: Normal heart sounds. No murmur heard.    No gallop.  Pulmonary:     Effort: Pulmonary effort is normal. No respiratory distress.     Breath sounds: Normal breath sounds. No stridor. No decreased breath sounds, wheezing, rhonchi or rales.  Abdominal:     General: There is no distension.     Palpations: Abdomen is soft.     Tenderness: There is no abdominal tenderness. There is no rebound.  Musculoskeletal:        General: No tenderness. Normal range of motion.     Cervical back: Normal range of motion and neck supple.  Skin:    General: Skin is warm and dry.     Findings: No abrasion or rash.  Neurological:     Mental Status: She is alert and oriented to person, place, and time. Mental status is at baseline.     GCS: GCS eye subscore is 4. GCS verbal subscore is 5. GCS motor subscore  is 6.     Cranial Nerves: Cranial nerves are intact. No cranial nerve deficit.     Sensory: No sensory deficit.     Motor: Motor function is intact.  Psychiatric:        Attention and Perception: Attention normal.        Speech: Speech normal.        Behavior: Behavior normal.     ED Results / Procedures / Treatments   Labs (all labs ordered are listed, but only abnormal results are displayed) Labs Reviewed - No data to display  EKG EKG Interpretation Date/Time:  Sunday August 27 2023 08:54:29 EST Ventricular Rate:  86 PR Interval:  160 QRS Duration:  84 QT Interval:  378 QTC Calculation: 452 R Axis:   21  Text Interpretation: Normal sinus rhythm Normal ECG When compared with ECG of 02-Aug-2019 10:58, No significant change was found Confirmed by Lorre Nick ([id number]) on  08/27/2023 9:06:34 AM  Radiology No results found.  Procedures Procedures    Medications Ordered in ED Medications - No data to display  ED Course/ Medical Decision Making/ A&P                                 Medical Decision Making Amount and/or Complexity of Data Reviewed Radiology: ordered. ECG/medicine tests: ordered.  Patient is EKG per interpretation shows normal sinus rhythm. Patient's chest x-ray per interpretation shows left lower lobe pneumonia.  Patient be placed on antibiotics and will follow-up with PCP        Final Clinical Impression(s) / ED Diagnoses Final diagnoses:  None    Rx / DC Orders ED Discharge Orders     None         Lorre Nick, MD 08/27/23 1004

## 2023-08-27 NOTE — Progress Notes (Deleted)
OFFICE VISIT  08/27/2023  CC: No chief complaint on file.   Patient is a 52 y.o. female who presents for cough.  HPI: ***  Past Medical History:  Diagnosis Date   Allergic rhinitis    Environmental allergens--multiple.  Patient to start immunotherapy 2023   Diabetes mellitus (HCC) 05/2018   Hba1c 6.5%   Diverticulosis 06/2021   colonoscopy   Food allergy    peanut, cucumber, mushroom, orange, sesame seed--question of?--but all food allergy testing neg 2023 at allergist   Gestational diabetes    Both pregnancies.   Hyperlipemia, mixed 05/2018   Great response to statin   Left carpal tunnel syndrome    Migraines    Multiple thyroid nodules    FNA of one nodule: benign follic nodule/cyst on 11/15/17.  f/u u/s 08/2019 stable nodules, no criteria for bx.  Rpt 01/2021->2 new nodules, need f/u u/s 1 yr.  Annual repeat rec'd until stable for 5 yrs (Dr. Arelia Sneddon).   Ureterolithiasis    R Ureteroscopy with holmium laser 08/05/19. CaOx. Right sided ESWL 11/2021    Past Surgical History:  Procedure Laterality Date   BIOPSY THYROID Right 11/15/2017   benign.     CESAREAN SECTION  08/03/2011   Procedure: CESAREAN SECTION;  Surgeon: Meriel Pica;  Location: [facility];  Service: Gynecology;  Laterality: N/A;   COLONOSCOPY  06/29/2020   no adenomas. Recall 2031   CYSTOSCOPY WITH RETROGRADE PYELOGRAM, URETEROSCOPY AND STENT PLACEMENT Right 08/05/2019   Procedure: CYSTOSCOPY WITH RETROGRADE PYELOGRAM, URETEROSCOPY AND STENT PLACEMENTwith laser;  Surgeon: Marcine Matar, MD;  Location: [facility];  Service: Urology;  Laterality: Right;  1 HR   EXTRACORPOREAL SHOCK WAVE LITHOTRIPSY Right 11/25/2021   Procedure: EXTRACORPOREAL SHOCK WAVE LITHOTRIPSY (ESWL);  Surgeon: Noel Christmas, MD;  [facility];  Service: Urology;  Laterality: Right;   HOLMIUM LASER APPLICATION Right 08/05/2019   Procedure: HOLMIUM LASER APPLICATION;  Surgeon: Marcine Matar, MD;  Location:  [facility];  Service: Urology;  Laterality: Right;   KNEE ARTHROSCOPY     WISDOM TOOTH EXTRACTION  2000    Outpatient Medications Prior to Visit  Medication Sig Dispense Refill   atorvastatin (LIPITOR) 80 MG tablet Take 1 tablet (80 mg total) by mouth daily. 90 tablet 1   benzonatate (TESSALON) 100 MG capsule Take 1 capsule (100 mg total) by mouth every 8 (eight) hours. 21 capsule 0   empagliflozin (JARDIANCE) 10 MG TABS tablet Take 1 tablet (10 mg total) by mouth daily. 90 tablet 1   ezetimibe (ZETIA) 10 MG tablet Take 1 tablet (10 mg total) by mouth daily. 90 tablet 1   fluticasone (FLONASE) 50 MCG/ACT nasal spray Place 2 sprays into both nostrils daily. 16 g 6   levofloxacin (LEVAQUIN) 750 MG tablet Take 1 tablet (750 mg total) by mouth daily. 7 tablet 0   pioglitazone (ACTOS) 15 MG tablet Take 1 tablet (15 mg total) by mouth daily. 90 tablet 1   No facility-administered medications prior to visit.    Allergies  Allergen Reactions   Metformin And Related Other (See Comments)    UPSET STOMACH   Cucumber Extract Rash   Mushroom Extract Complex Rash   Peanut-Containing Drug Products Rash   Soy Allergy Rash    Review of Systems  As per HPI  PE:    08/27/2023   10:00 AM 08/27/2023    9:20 AM 08/27/2023    9:19 AM  Vitals with BMI  Systolic 113  107  Diastolic 96  86  Pulse 85 84 84     Physical Exam  ***  LABS:  {Labs (Optional):23779}  IMPRESSION AND PLAN:  No problem-specific Assessment & Plan notes found for this encounter.   An After Visit Summary was printed and given to the patient.  FOLLOW UP: No follow-ups on file.  Signed:  Santiago Bumpers, MD           08/27/2023

## 2023-08-28 ENCOUNTER — Ambulatory Visit: Payer: Commercial Managed Care - PPO | Admitting: Family Medicine

## 2023-09-13 ENCOUNTER — Encounter: Payer: Self-pay | Admitting: Family Medicine

## 2023-09-13 MED ORDER — BENZONATATE 100 MG PO CAPS: 100.0000 mg | ORAL_CAPSULE | Freq: Three times a day (TID) | ORAL | 0 refills | Status: DC

## 2023-09-13 NOTE — Telephone Encounter (Signed)
Okay, benzonatate prescription sent

## 2023-09-15 ENCOUNTER — Ambulatory Visit: Payer: Commercial Managed Care - PPO | Admitting: Family Medicine

## 2023-09-15 ENCOUNTER — Encounter: Payer: Self-pay | Admitting: Family Medicine

## 2023-09-15 VITALS — BP 120/80 | HR 73 | Wt 145.8 lb

## 2023-09-15 DIAGNOSIS — Z7984 Long term (current) use of oral hypoglycemic drugs: Secondary | ICD-10-CM

## 2023-09-15 DIAGNOSIS — J189 Pneumonia, unspecified organism: Secondary | ICD-10-CM | POA: Diagnosis not present

## 2023-09-15 DIAGNOSIS — E119 Type 2 diabetes mellitus without complications: Secondary | ICD-10-CM

## 2023-09-15 MED ORDER — SEMAGLUTIDE(0.25 OR 0.5MG/DOS) 2 MG/3ML ~~LOC~~ SOPN: PEN_INJECTOR | SUBCUTANEOUS | 0 refills | Status: DC

## 2023-09-15 NOTE — Patient Instructions (Addendum)
If you still need cough medication when you run out of benzonatate, purchase over-the-counter Mucinex DM and take as directed on packaging.

## 2023-09-15 NOTE — Progress Notes (Signed)
OFFICE VISIT  09/15/2023  CC:  Chief Complaint  Patient presents with   Hospitalization Follow-up    Seen in ED 11/17; X-ray did show some concern for pneumonia. Pt states she does feel like she has improved; still has a lingering cough and fatigue.     Patient is a 52 y.o. female who presents for emergency department follow-up from 08/27/2023. Presented with prolonged cough. Chest x-ray showed subtle/early left lower lobe infiltrate. She was started on Levaquin 750 mg x 1 week.  Benzonatate for cough.  INTERIM HX: Still has some lingering cough and mucus production but overall feels back to baseline.    She wants to try Ozempic.  She is frustrated with a gradual weight gain of 20 pounds over the last year.  Review of systems: No fever, no shortness of breath, no wheezing, no chest tightness, no chest pain.   Past Medical History:  Diagnosis Date   Allergic rhinitis    Environmental allergens--multiple.  Patient to start immunotherapy 2023   Diabetes mellitus (HCC) 05/2018   Hba1c 6.5%   Diverticulosis 06/2021   colonoscopy   Food allergy    peanut, cucumber, mushroom, orange, sesame seed--question of?--but all food allergy testing neg 2023 at allergist   Gestational diabetes    Both pregnancies.   Hyperlipemia, mixed 05/2018   Great response to statin   Left carpal tunnel syndrome    Migraines    Multiple thyroid nodules    FNA of one nodule: benign follic nodule/cyst on 11/15/17.  f/u u/s 08/2019 stable nodules, no criteria for bx.  Rpt 01/2021->2 new nodules, need f/u u/s 1 yr.  Annual repeat rec'd until stable for 5 yrs (Dr. Arelia Sneddon).   Ureterolithiasis    R Ureteroscopy with holmium laser 08/05/19. CaOx. Right sided ESWL 11/2021    Past Surgical History:  Procedure Laterality Date   BIOPSY THYROID Right 11/15/2017   benign.     CESAREAN SECTION  08/03/2011   Procedure: CESAREAN SECTION;  Surgeon: Meriel Pica;  Location: [facility];  Service: Gynecology;   Laterality: N/A;   COLONOSCOPY  06/29/2020   no adenomas. Recall 2031   CYSTOSCOPY WITH RETROGRADE PYELOGRAM, URETEROSCOPY AND STENT PLACEMENT Right 08/05/2019   Procedure: CYSTOSCOPY WITH RETROGRADE PYELOGRAM, URETEROSCOPY AND STENT PLACEMENTwith laser;  Surgeon: Marcine Matar, MD;  Location: [facility];  Service: Urology;  Laterality: Right;  1 HR   EXTRACORPOREAL SHOCK WAVE LITHOTRIPSY Right 11/25/2021   Procedure: EXTRACORPOREAL SHOCK WAVE LITHOTRIPSY (ESWL);  Surgeon: Noel Christmas, MD;  Location: [facility];  Service: Urology;  Laterality: Right;   HOLMIUM LASER APPLICATION Right 08/05/2019   Procedure: HOLMIUM LASER APPLICATION;  Surgeon: Marcine Matar, MD;  Location: [facility];  Service: Urology;  Laterality: Right;   KNEE ARTHROSCOPY     WISDOM TOOTH EXTRACTION  2000    Outpatient Medications Prior to Visit  Medication Sig Dispense Refill   atorvastatin (LIPITOR) 80 MG tablet Take 1 tablet (80 mg total) by mouth daily. 90 tablet 1   benzonatate (TESSALON) 100 MG capsule Take 1 capsule (100 mg total) by mouth every 8 (eight) hours. 21 capsule 0   empagliflozin (JARDIANCE) 10 MG TABS tablet Take 1 tablet (10 mg total) by mouth daily. 90 tablet 1   ezetimibe (ZETIA) 10 MG tablet Take 1 tablet (10 mg total) by mouth daily. 90 tablet 1   fluticasone (FLONASE) 50 MCG/ACT nasal spray Place 2 sprays into both nostrils daily. 16 g 6   pioglitazone (ACTOS) 15  MG tablet Take 1 tablet (15 mg total) by mouth daily. 90 tablet 1   levofloxacin (LEVAQUIN) 750 MG tablet Take 1 tablet (750 mg total) by mouth daily. (Patient not taking: Reported on 09/15/2023) 7 tablet 0   No facility-administered medications prior to visit.    Allergies  Allergen Reactions   Metformin And Related Other (See Comments)    UPSET STOMACH   Cucumber Extract Rash   Mushroom Extract Complex (Do Not Select) Rash   Peanut-Containing Drug Products Rash   Soy Allergy (Do Not Select) Rash     Review of Systems As per HPI  PE:    09/15/2023   11:25 AM 08/27/2023   10:00 AM 08/27/2023    9:20 AM  Vitals with BMI  Weight 145 lbs 13 oz    Systolic 120 113   Diastolic 80 96   Pulse 73 85 84     Physical Exam  Gen: Alert, well appearing.  Patient is oriented to person, place, time, and situation. AFFECT: pleasant, lucid thought and speech. CV: RRR, no m/r/g.   LUNGS: CTA bilat, nonlabored resps, good aeration in all lung fields.   LABS:  Last CBC Lab Results  Component Value Date   WBC 5.7 08/09/2023   HGB 14.1 08/09/2023   HCT 44.3 08/09/2023   MCV 93.9 08/09/2023   MCH 29.5 08/02/2019   RDW 14.1 08/09/2023   PLT 262.0 08/09/2023   Last metabolic panel Lab Results  Component Value Date   GLUCOSE 108 (H) 08/09/2023   NA 142 08/09/2023   K 4.3 08/09/2023   CL 106 08/09/2023   CO2 30 08/09/2023   BUN 20 08/09/2023   CREATININE 0.64 08/09/2023   GFR 100.21 08/09/2023   CALCIUM 9.5 08/09/2023   PROT 7.4 08/09/2023   ALBUMIN 4.2 08/09/2023   BILITOT 0.6 08/09/2023   ALKPHOS 86 08/09/2023   AST 25 08/09/2023   ALT 39 (H) 08/09/2023   ANIONGAP 9 08/02/2019   Lab Results  Component Value Date   HGBA1C 6.7 (A) 08/09/2023   HGBA1C 6.7 08/09/2023   HGBA1C 6.7 (A) 08/09/2023   HGBA1C 6.7 08/09/2023    Chest x-ray 08/27/2023: FINDINGS: Lung volumes are normal. There is a area of subtle interstitial prominence an ill-defined airspace disease in the left lower lung, not well demonstrated on the lateral projection, but likely in the left lower lobe, concerning for early developing bronchopneumonia. Right lung is clear. No pleural effusions. No pneumothorax. No evidence of pulmonary edema. Heart size is normal. Upper mediastinal contours are within normal limits.   IMPRESSION: 1. Probable left lower lobe bronchopneumonia. Followup PA and lateral chest X-ray is recommended in 3-4 weeks following trial of antibiotic therapy to ensure resolution  and exclude underlying malignancy. IMPRESSION AND PLAN:  #1 community-acquired pneumonia. Resolving appropriately from a clinical standpoint. Needs follow-up chest x-ray in 10 to 14 days--> ordered today.   Continue benzonatate 100 to 200 mg every 8 hours as needed.  #2 diabetes without complication. Most recent hemoglobin A1c on 08/09/2023 was 6.7%. She desires trial of GLP-1 inhibitor. Prescription for Ozempic 0.5 mg SQ weekly sent today. For now, we will keep her on her Jardiance 10 mg a day and pioglitazone 15 mg a day.  An After Visit Summary was printed and given to the patient.  FOLLOW UP: Return in about 4 weeks (around 10/13/2023) for f/u DM/GLP1-I.  Signed:  Santiago Bumpers, MD           09/15/2023

## 2023-09-18 ENCOUNTER — Encounter: Payer: Self-pay | Admitting: Family Medicine

## 2023-09-25 ENCOUNTER — Other Ambulatory Visit: Payer: Self-pay | Admitting: Obstetrics and Gynecology

## 2023-09-25 DIAGNOSIS — Z803 Family history of malignant neoplasm of breast: Secondary | ICD-10-CM

## 2023-09-29 ENCOUNTER — Ambulatory Visit
Admission: RE | Admit: 2023-09-29 | Discharge: 2023-09-29 | Disposition: A | Discharge status: Home / Self Care | Payer: Commercial Managed Care - PPO | Source: Ambulatory Visit | Attending: Family Medicine | Admitting: Family Medicine

## 2023-09-29 DIAGNOSIS — J189 Pneumonia, unspecified organism: Secondary | ICD-10-CM

## 2023-10-13 ENCOUNTER — Other Ambulatory Visit: Payer: Self-pay | Admitting: Family Medicine

## 2023-11-09 ENCOUNTER — Ambulatory Visit: Payer: Commercial Managed Care - PPO | Admitting: Family Medicine

## 2023-11-09 ENCOUNTER — Encounter: Payer: Self-pay | Admitting: Family Medicine

## 2023-11-09 VITALS — BP 91/68 | HR 87 | Wt 138.0 lb

## 2023-11-09 DIAGNOSIS — E119 Type 2 diabetes mellitus without complications: Secondary | ICD-10-CM

## 2023-11-09 DIAGNOSIS — E7800 Pure hypercholesterolemia, unspecified: Secondary | ICD-10-CM

## 2023-11-09 DIAGNOSIS — Z7985 Long-term (current) use of injectable non-insulin antidiabetic drugs: Secondary | ICD-10-CM | POA: Diagnosis not present

## 2023-11-09 DIAGNOSIS — Z7984 Long term (current) use of oral hypoglycemic drugs: Secondary | ICD-10-CM

## 2023-11-09 LAB — COMPREHENSIVE METABOLIC PANEL
ALT: 23 U/L (ref 0–35)
AST: 22 U/L (ref 0–37)
Albumin: 4.5 g/dL (ref 3.5–5.2)
Alkaline Phosphatase: 70 U/L (ref 39–117)
BUN: 19 mg/dL (ref 6–23)
CO2: 29 meq/L (ref 19–32)
Calcium: 9.8 mg/dL (ref 8.4–10.5)
Chloride: 102 meq/L (ref 96–112)
Creatinine, Ser: 0.69 mg/dL (ref 0.40–1.20)
GFR: 98.24 mL/min (ref >60.00)
Glucose, Bld: 89 mg/dL (ref 70–99)
Potassium: 4.4 meq/L (ref 3.5–5.1)
Sodium: 140 meq/L (ref 135–145)
Total Bilirubin: 0.7 mg/dL (ref 0.2–1.2)
Total Protein: 7.5 g/dL (ref 6.0–8.3)

## 2023-11-09 LAB — LIPID PANEL
Cholesterol: 118 mg/dL (ref 0–200)
HDL: 53.5 mg/dL (ref >39.00)
LDL Cholesterol: 52 mg/dL (ref 0–99)
NonHDL: 64.18
Total CHOL/HDL Ratio: 2
Triglycerides: 61 mg/dL (ref 0.0–149.0)
VLDL: 12.2 mg/dL (ref 0.0–40.0)

## 2023-11-09 LAB — POCT GLYCOSYLATED HEMOGLOBIN (HGB A1C)
HbA1c POC (<> result, manual entry): 6.1 % (ref 4.0–5.6)
HbA1c, POC (controlled diabetic range): 6.1 % (ref 0.0–7.0)
HbA1c, POC (prediabetic range): 6.1 % (ref 5.7–6.4)
Hemoglobin A1C: 6.1 % — AB (ref 4.0–5.6)

## 2023-11-09 MED ORDER — OZEMPIC (0.25 OR 0.5 MG/DOSE) 2 MG/3ML ~~LOC~~ SOPN: PEN_INJECTOR | SUBCUTANEOUS | 2 refills | Status: DC

## 2023-11-09 NOTE — Progress Notes (Signed)
OFFICE VISIT  11/09/2023  CC:  Chief Complaint  Patient presents with   Diabetes    Pt is fasting.     Patient is a 53 y.o. female who presents for follow-up diabetes and hyperlipidemia. A/P as of last visit: "1 diabetes without complication. Most recent hemoglobin A1c on 08/09/2023 was 6.7%. She desires trial of GLP-1 inhibitor. Prescription for Ozempic 0.5 mg SQ weekly sent today. For now, we will keep her on her Jardiance 10 mg a day and pioglitazone 15 mg a day.  2. Hypercholesterolemia, doing well long-term on Lipitor 80 mg a day and Zetia 10 mg a day. Lipid panel and hepatic panel today."  INTERIM HX: Added Ozempic about 7 weeks ago. Feeling well.  Feels great since getting on ozempic. More active lately as well.   3 months ago her LDL had risen.  We stayed with 80 mg atorvastatin and 10 mg Zetia.  Plan was to recheck today and if still rising or if not down to goal would talk about PCSK9 inhibitor.  Past Medical History:  Diagnosis Date   Allergic rhinitis    Environmental allergens--multiple.  Patient to start immunotherapy 2023   Diabetes mellitus (HCC) 05/2018   Hba1c 6.5%   Diverticulosis 06/2021   colonoscopy   Food allergy    peanut, cucumber, mushroom, orange, sesame seed--question of?--but all food allergy testing neg 2023 at allergist   Gestational diabetes    Both pregnancies.   Hyperlipemia, mixed 05/2018   Great response to statin   Left carpal tunnel syndrome    Migraines    Multiple thyroid nodules    FNA of one nodule: benign follic nodule/cyst on 11/15/17.  f/u u/s 08/2019 stable nodules, no criteria for bx.  Rpt 01/2021->2 new nodules, need f/u u/s 1 yr.  Annual repeat rec'd until stable for 5 yrs (Dr. Arelia Sneddon).   Ureterolithiasis    R Ureteroscopy with holmium laser 08/05/19. CaOx. Right sided ESWL 11/2021    Past Surgical History:  Procedure Laterality Date   BIOPSY THYROID Right 11/15/2017   benign.     CESAREAN SECTION  08/03/2011    Procedure: CESAREAN SECTION;  Surgeon: Meriel Pica;  Location: [facility];  Service: Gynecology;  Laterality: N/A;   COLONOSCOPY  06/29/2020   no adenomas. Recall 2031   CYSTOSCOPY WITH RETROGRADE PYELOGRAM, URETEROSCOPY AND STENT PLACEMENT Right 08/05/2019   Procedure: CYSTOSCOPY WITH RETROGRADE PYELOGRAM, URETEROSCOPY AND STENT PLACEMENTwith laser;  Surgeon: Marcine Matar, MD;  Location: [facility];  Service: Urology;  Laterality: Right;  1 HR   EXTRACORPOREAL SHOCK WAVE LITHOTRIPSY Right 11/25/2021   Procedure: EXTRACORPOREAL SHOCK WAVE LITHOTRIPSY (ESWL);  Surgeon: Noel Christmas, MD;  [facility];  Service: Urology;  Laterality: Right;   HOLMIUM LASER APPLICATION Right 08/05/2019   Procedure: HOLMIUM LASER APPLICATION;  Surgeon: Marcine Matar, MD;  Location: [facility];  Service: Urology;  Laterality: Right;   KNEE ARTHROSCOPY     WISDOM TOOTH EXTRACTION  2000    Outpatient Medications Prior to Visit  Medication Sig Dispense Refill   atorvastatin (LIPITOR) 80 MG tablet Take 1 tablet (80 mg total) by mouth daily. 90 tablet 1   empagliflozin (JARDIANCE) 10 MG TABS tablet Take 1 tablet (10 mg total) by mouth daily. 90 tablet 1   ezetimibe (ZETIA) 10 MG tablet Take 1 tablet (10 mg total) by mouth daily. 90 tablet 1   fluticasone (FLONASE) 50 MCG/ACT nasal spray Place 2 sprays into both nostrils daily. 16 g 6  benzonatate (TESSALON) 100 MG capsule Take 1 capsule (100 mg total) by mouth every 8 (eight) hours. 21 capsule 0   OZEMPIC, 0.25 OR 0.5 MG/DOSE, 2 MG/3ML SOPN INJECT 0.5mg  Subcutaneously every 7 days 3 mL 0   pioglitazone (ACTOS) 15 MG tablet Take 1 tablet (15 mg total) by mouth daily. 90 tablet 1   No facility-administered medications prior to visit.    Allergies  Allergen Reactions   Metformin And Related Other (See Comments)    UPSET STOMACH   Cucumber Extract Rash   Mushroom Extract Complex (Do Not Select) Rash   Peanut-Containing Drug  Products Rash   Soy Allergy (Do Not Select) Rash    Review of Systems As per HPI  PE:    11/09/2023    8:03 AM 09/15/2023   11:25 AM 08/27/2023   10:00 AM  Vitals with BMI  Weight 138 lbs 145 lbs 13 oz   Systolic 91 120 113  Diastolic 68 80 96  Pulse 87 73 85     Physical Exam  Gen: Alert, well appearing.  Patient is oriented to person, place, time, and situation. AFFECT: pleasant, lucid thought and speech. No further exam today.  LABS:  Last CBC Lab Results  Component Value Date   WBC 5.7 08/09/2023   HGB 14.1 08/09/2023   HCT 44.3 08/09/2023   MCV 93.9 08/09/2023   MCH 29.5 08/02/2019   RDW 14.1 08/09/2023   PLT 262.0 08/09/2023   Last metabolic panel Lab Results  Component Value Date   GLUCOSE 108 (H) 08/09/2023   NA 142 08/09/2023   K 4.3 08/09/2023   CL 106 08/09/2023   CO2 30 08/09/2023   BUN 20 08/09/2023   CREATININE 0.64 08/09/2023   GFR 100.21 08/09/2023   CALCIUM 9.5 08/09/2023   PROT 7.4 08/09/2023   ALBUMIN 4.2 08/09/2023   BILITOT 0.6 08/09/2023   ALKPHOS 86 08/09/2023   AST 25 08/09/2023   ALT 39 (H) 08/09/2023   ANIONGAP 9 08/02/2019   Last lipids Lab Results  Component Value Date   CHOL 177 08/09/2023   HDL 54.20 08/09/2023   LDLCALC 108 (H) 08/09/2023   TRIG 70.0 08/09/2023   CHOLHDL 3 08/09/2023   Last hemoglobin A1c Lab Results  Component Value Date   HGBA1C 6.7 (A) 08/09/2023   HGBA1C 6.7 08/09/2023   HGBA1C 6.7 (A) 08/09/2023   HGBA1C 6.7 08/09/2023    IMPRESSION AND PLAN:  #1 diabetes without complication.  Great control.  Hemoglobin A1c today is 6.1% Will continue Ozempic 0.5 mg SQ weekly and Jardiance 10 mg a day. Discontinue pioglitazone. Electrolytes and creatinine today.  2.  Hypercholesterolemia, tolerating Lipitor 80 mg a day and Zetia 10 mg a day. LDL was 108 at last check 3 months ago. We discussed goal LDL of less than 70. Lipid panel and hepatic panel today. If LDL is not at goal we will refer to  advanced lipid clinic for consideration of PCSK9 inhibitor.  An After Visit Summary was printed and given to the patient.  FOLLOW UP: Return in about 3 months (around 02/07/2024) for routine chronic illness f/u. Next CPE approximately 08/08/2024 Signed:  Santiago Bumpers, MD           11/09/2023

## 2023-11-09 NOTE — Patient Instructions (Signed)
Stop the pioglitazone.  Continue everything else as is.

## 2023-11-10 ENCOUNTER — Ambulatory Visit
Admission: RE | Admit: 2023-11-10 | Discharge: 2023-11-10 | Disposition: A | Discharge status: Home / Self Care | Payer: Commercial Managed Care - PPO | Source: Ambulatory Visit | Attending: Obstetrics and Gynecology | Admitting: Obstetrics and Gynecology

## 2023-11-10 DIAGNOSIS — Z803 Family history of malignant neoplasm of breast: Secondary | ICD-10-CM

## 2023-11-10 MED ORDER — GADOPICLENOL 0.5 MMOL/ML IV SOLN
6.0000 mL | Freq: Once | INTRAVENOUS | Status: AC | PRN
  Administered 2023-11-10: 6 mL via INTRAVENOUS

## 2024-01-15 ENCOUNTER — Other Ambulatory Visit: Payer: Self-pay | Admitting: Family Medicine

## 2024-02-09 ENCOUNTER — Other Ambulatory Visit: Payer: Self-pay | Admitting: Family Medicine

## 2024-02-14 ENCOUNTER — Other Ambulatory Visit: Payer: Self-pay | Admitting: Family Medicine

## 2024-02-16 ENCOUNTER — Encounter: Payer: Self-pay | Admitting: Family Medicine

## 2024-02-16 ENCOUNTER — Ambulatory Visit: Payer: Commercial Managed Care - PPO | Admitting: Family Medicine

## 2024-02-16 VITALS — BP 113/81 | HR 84 | Wt 126.4 lb

## 2024-02-16 DIAGNOSIS — E119 Type 2 diabetes mellitus without complications: Secondary | ICD-10-CM | POA: Diagnosis not present

## 2024-02-16 DIAGNOSIS — G8929 Other chronic pain: Secondary | ICD-10-CM

## 2024-02-16 DIAGNOSIS — E7800 Pure hypercholesterolemia, unspecified: Secondary | ICD-10-CM | POA: Diagnosis not present

## 2024-02-16 DIAGNOSIS — Z7984 Long term (current) use of oral hypoglycemic drugs: Secondary | ICD-10-CM | POA: Diagnosis not present

## 2024-02-16 DIAGNOSIS — M533 Sacrococcygeal disorders, not elsewhere classified: Secondary | ICD-10-CM | POA: Diagnosis not present

## 2024-02-16 LAB — BASIC METABOLIC PANEL WITH GFR
BUN: 15 mg/dL (ref 6–23)
CO2: 28 meq/L (ref 19–32)
Calcium: 9.5 mg/dL (ref 8.4–10.5)
Chloride: 105 meq/L (ref 96–112)
Creatinine, Ser: 0.64 mg/dL (ref 0.40–1.20)
GFR: 99.84 mL/min (ref >60.00)
Glucose, Bld: 94 mg/dL (ref 70–99)
Potassium: 4.1 meq/L (ref 3.5–5.1)
Sodium: 141 meq/L (ref 135–145)

## 2024-02-16 LAB — POCT GLYCOSYLATED HEMOGLOBIN (HGB A1C)
HbA1c POC (<> result, manual entry): 5.8 % (ref 4.0–5.6)
HbA1c, POC (controlled diabetic range): 5.8 % (ref 0.0–7.0)
HbA1c, POC (prediabetic range): 5.8 % (ref 5.7–6.4)
Hemoglobin A1C: 5.8 % — AB (ref 4.0–5.6)

## 2024-02-16 MED ORDER — ATORVASTATIN CALCIUM 80 MG PO TABS: 80.0000 mg | ORAL_TABLET | Freq: Every day | ORAL | 3 refills | Status: DC

## 2024-02-16 MED ORDER — EMPAGLIFLOZIN 10 MG PO TABS
10.0000 mg | ORAL_TABLET | Freq: Every day | ORAL | 3 refills | Status: DC
Start: 2024-02-16 — End: 2024-05-10

## 2024-02-16 MED ORDER — EZETIMIBE 10 MG PO TABS: 10.0000 mg | ORAL_TABLET | Freq: Every day | ORAL | 3 refills | Status: DC

## 2024-02-16 MED ORDER — OZEMPIC (0.25 OR 0.5 MG/DOSE) 2 MG/3ML ~~LOC~~ SOPN: PEN_INJECTOR | SUBCUTANEOUS | 3 refills | Status: DC

## 2024-02-16 MED ORDER — FLUTICASONE PROPIONATE 50 MCG/ACT NA SUSP: 2.0000 | Freq: Every day | NASAL | 6 refills | Status: AC

## 2024-02-16 NOTE — Progress Notes (Signed)
 OFFICE VISIT  02/16/2024  CC:  Chief Complaint  Patient presents with   Diabetes    Patient is a 53 y.o. female who presents for 67-month follow-up diabetes and hyperlipidemia. A/P as of last visit: "#1 diabetes without complication.  Great control.  Hemoglobin A1c today is 6.1% Will continue Ozempic  0.5 mg SQ weekly and Jardiance  10 mg a day. Discontinue pioglitazone . Electrolytes and creatinine today.   2.  Hypercholesterolemia, tolerating Lipitor 80 mg a day and Zetia  10 mg a day. LDL was 108 at last check 3 months ago. We discussed goal LDL of less than 70. Lipid panel and hepatic panel today. If LDL is not at goal we will refer to advanced lipid clinic for consideration of PCSK9 inhibitor."  INTERIM HX: Tiffany Hicks is doing well overall. Diet and exercise levels are great and she is very pleased with Ozempic .  She does have nagging chronic pain in the right SI joint region.  Says it started around the time she had to get her knee arthroscopy done last September. The pain is constant and is worse after walking long distances.  She can sit without problem.  The pain sometimes extends down her leg but nothing like a sharp/shooting radiating pain.  No paresthesias. No anterior or lateral hip pain.   Past Medical History:  Diagnosis Date   Allergic rhinitis    Environmental allergens--multiple.  Patient to start immunotherapy 2023   Diabetes mellitus (HCC) 05/2018   Hba1c 6.5%   Diverticulosis 06/2021   colonoscopy   Food allergy    peanut, cucumber, mushroom, orange, sesame seed--question of?--but all food allergy testing neg 2023 at allergist   Gestational diabetes    Both pregnancies.   Hyperlipemia, mixed 05/2018   Great response to statin   Left carpal tunnel syndrome    Migraines    Multiple thyroid  nodules    FNA of one nodule: benign follic nodule/cyst on 11/15/17.  f/u u/s 08/2019 stable nodules, no criteria for bx.  Rpt 01/2021->2 new nodules, need f/u u/s 1 yr.  Annual  repeat rec'd until stable for 5 yrs (Dr. McComb).   Ureterolithiasis    R Ureteroscopy with holmium laser 08/05/19. CaOx. Right sided ESWL 11/2021    Past Surgical History:  Procedure Laterality Date   BIOPSY THYROID  Right 11/15/2017   benign.     CESAREAN SECTION  08/03/2011   Procedure: CESAREAN SECTION;  Surgeon: Piedad Brewer;  Location: [facility];  Service: Gynecology;  Laterality: N/A;   COLONOSCOPY  06/29/2020   no adenomas. Recall 2031   CYSTOSCOPY WITH RETROGRADE PYELOGRAM, URETEROSCOPY AND STENT PLACEMENT Right 08/05/2019   Procedure: CYSTOSCOPY WITH RETROGRADE PYELOGRAM, URETEROSCOPY AND STENT PLACEMENTwith laser;  Surgeon: Trent Frizzle, MD;  Location: [facility];  Service: Urology;  Laterality: Right;  1 HR   EXTRACORPOREAL SHOCK WAVE LITHOTRIPSY Right 11/25/2021   Procedure: EXTRACORPOREAL SHOCK WAVE LITHOTRIPSY (ESWL);  Surgeon: Roxane Copp, MD;  Location[facility];  Service: Urology;  Laterality: Right;   HOLMIUM LASER APPLICATION Right 08/05/2019   Procedure: HOLMIUM LASER APPLICATION;  Surgeon: Trent Frizzle, MD;  Location: [facility];  Service: Urology;  Laterality: Right;   KNEE ARTHROSCOPY     WISDOM TOOTH EXTRACTION  2000    Outpatient Medications Prior to Visit  Medication Sig Dispense Refill   atorvastatin  (LIPITOR) 80 MG tablet Take 1 tablet (80 mg total) by mouth daily. MUST KEEP APPT FOR FURTHER REFILLS 30 tablet 0   empagliflozin  (JARDIANCE ) 10 MG TABS tablet  Take 1 tablet (10 mg total) by mouth daily. 90 tablet 1   ezetimibe  (ZETIA ) 10 MG tablet Take 1 tablet (10 mg total) by mouth daily. MUST KEEP APPT FOR FURTHER REFILLS 30 tablet 0   fluticasone  (FLONASE ) 50 MCG/ACT nasal spray Place 2 sprays into both nostrils daily. 16 g 6   Semaglutide ,0.25 or 0.5MG /DOS, (OZEMPIC , 0.25 OR 0.5 MG/DOSE,) 2 MG/3ML SOPN INJECT 0.5 mg into the skin every 7 days. MUST KEEP APPT FOR FURTHER REFILLS 3 mL 0   No facility-administered medications prior  to visit.    Allergies  Allergen Reactions   Metformin  And Related Other (See Comments)    UPSET STOMACH   Cucumber Extract Rash   Mushroom Extract Complex (Obsolete) Rash   Peanut-Containing Drug Products Rash   Soy Allergy (Obsolete) Rash    Review of Systems As per HPI  PE:    02/16/2024    7:50 AM 11/09/2023    8:03 AM 09/15/2023   11:25 AM  Vitals with BMI  Weight 126 lbs 6 oz 138 lbs 145 lbs 13 oz  Systolic 113 91 120  Diastolic 81 68 80  Pulse 84 87 73     Physical Exam  Exam chaperoned by Cloe Motsinger, CMA General: Alert and well-appearing. Right SI joint focal tenderness to palpation. Her pain is increased with pelvic distraction testing and sacral pressure testing. Supine straight leg raise negative. FABER and FADIR intact and do not elicit any significant pain except for extreme of abduction. Leg strength 5 out of 5 proximally and distally. Foot exam - normal; no swelling, tenderness or skin or vascular lesions. Color and temperature is normal. Sensation is intact. Peripheral pulses are palpable. Toenails are normal.  LABS:  Last CBC Lab Results  Component Value Date   WBC 5.7 08/09/2023   HGB 14.1 08/09/2023   HCT 44.3 08/09/2023   MCV 93.9 08/09/2023   MCH 29.5 08/02/2019   RDW 14.1 08/09/2023   PLT 262.0 08/09/2023   Last metabolic panel Lab Results  Component Value Date   GLUCOSE 89 11/09/2023   NA 140 11/09/2023   K 4.4 11/09/2023   CL 102 11/09/2023   CO2 29 11/09/2023   BUN 19 11/09/2023   CREATININE 0.69 11/09/2023   GFR 98.24 11/09/2023   CALCIUM  9.8 11/09/2023   PROT 7.5 11/09/2023   ALBUMIN 4.5 11/09/2023   BILITOT 0.7 11/09/2023   ALKPHOS 70 11/09/2023   AST 22 11/09/2023   ALT 23 11/09/2023   ANIONGAP 9 08/02/2019   Last lipids Lab Results  Component Value Date   CHOL 118 11/09/2023   HDL 53.50 11/09/2023   LDLCALC 52 11/09/2023   TRIG 61.0 11/09/2023   CHOLHDL 2 11/09/2023   Last hemoglobin A1c Lab Results   Component Value Date   HGBA1C 5.8 (A) 02/16/2024   HGBA1C 5.8 02/16/2024   HGBA1C 5.8 02/16/2024   HGBA1C 5.8 02/16/2024   Last thyroid  functions Lab Results  Component Value Date   TSH 0.98 08/09/2023   T3TOTAL 100 10/15/2021   IMPRESSION AND PLAN:  1 diabetes without complication.  Great control.  Hemoglobin A1c today is 5.8%. Will continue Ozempic  0.5 mg SQ weekly and Jardiance  10 mg a day. Feet exam normal today. Electrolytes and creatinine today.   2.  Hypercholesterolemia, tolerating Lipitor 80 mg a day and Zetia  10 mg a day. LDL was 52 approximately 3 months ago. Plan repeat lipids 3 months.  #3 right SI joint pain. Physical therapy recommended--> ordered today (  [facility]). If not significantly improved over the next couple of months then she can return here to discuss SI joint injection or she can get further evaluation by her orthopedist.  An After Visit Summary was printed and given to the patient.  FOLLOW UP: Return in about 3 months (around 05/18/2024) for routine chronic illness f/u. Next CPE 07/2024 Signed:  Arletha Lady, MD           02/16/2024

## 2024-05-10 ENCOUNTER — Ambulatory Visit: Admitting: Family Medicine

## 2024-05-10 ENCOUNTER — Encounter: Payer: Self-pay | Admitting: Family Medicine

## 2024-05-10 VITALS — BP 103/70 | HR 79 | Temp 98.5°F | Ht 61.0 in | Wt 125.2 lb

## 2024-05-10 DIAGNOSIS — E119 Type 2 diabetes mellitus without complications: Secondary | ICD-10-CM

## 2024-05-10 DIAGNOSIS — E7800 Pure hypercholesterolemia, unspecified: Secondary | ICD-10-CM | POA: Diagnosis not present

## 2024-05-10 DIAGNOSIS — Z7984 Long term (current) use of oral hypoglycemic drugs: Secondary | ICD-10-CM | POA: Diagnosis not present

## 2024-05-10 LAB — POCT GLYCOSYLATED HEMOGLOBIN (HGB A1C)
HbA1c POC (<> result, manual entry): 5.9 % (ref 4.0–5.6)
HbA1c, POC (controlled diabetic range): 5.9 % (ref 0.0–7.0)
HbA1c, POC (prediabetic range): 5.9 % — AB (ref 5.7–6.4)
Hemoglobin A1C: 5.9 % — AB (ref 4.0–5.6)

## 2024-05-10 LAB — LIPID PANEL
Cholesterol: 111 mg/dL (ref 0–200)
HDL: 51.8 mg/dL (ref >39.00)
LDL Cholesterol: 48 mg/dL (ref 0–99)
NonHDL: 58.72
Total CHOL/HDL Ratio: 2
Triglycerides: 55 mg/dL (ref 0.0–149.0)
VLDL: 11 mg/dL (ref 0.0–40.0)

## 2024-05-10 LAB — COMPREHENSIVE METABOLIC PANEL WITH GFR
ALT: 41 U/L — ABNORMAL HIGH (ref 0–35)
AST: 25 U/L (ref 0–37)
Albumin: 4.2 g/dL (ref 3.5–5.2)
Alkaline Phosphatase: 74 U/L (ref 39–117)
BUN: 18 mg/dL (ref 6–23)
CO2: 28 meq/L (ref 19–32)
Calcium: 9.3 mg/dL (ref 8.4–10.5)
Chloride: 106 meq/L (ref 96–112)
Creatinine, Ser: 0.59 mg/dL (ref 0.40–1.20)
GFR: 101.65 mL/min (ref >60.00)
Glucose, Bld: 90 mg/dL (ref 70–99)
Potassium: 4.4 meq/L (ref 3.5–5.1)
Sodium: 141 meq/L (ref 135–145)
Total Bilirubin: 0.5 mg/dL (ref 0.2–1.2)
Total Protein: 7.4 g/dL (ref 6.0–8.3)

## 2024-05-10 LAB — MICROALBUMIN / CREATININE URINE RATIO
Creatinine,U: 35.6 mg/dL
Microalb Creat Ratio: UNDETERMINED mg/g (ref 0.0–30.0)
Microalb, Ur: <0.7 mg/dL

## 2024-05-10 MED ORDER — EMPAGLIFLOZIN 10 MG PO TABS: ORAL_TABLET | ORAL | Status: DC

## 2024-05-10 NOTE — Patient Instructions (Signed)
 OK to take only 1/2 of your jardiance  tab daily

## 2024-05-10 NOTE — Progress Notes (Signed)
 OFFICE VISIT  05/10/2024  CC:  Chief Complaint  Patient presents with   Medical Management of Chronic Issues    Pt is fasting    Patient is a 53 y.o. female who presents for 3 mo f/u DM, HLD. A/P as of last visit: 1 diabetes without complication.  Great control.  Hemoglobin A1c today is 5.8%. Will continue Ozempic  0.5 mg SQ weekly and Jardiance  10 mg a day. Feet exam normal today. Electrolytes and creatinine today.   2.  Hypercholesterolemia, tolerating Lipitor 80 mg a day and Zetia  10 mg a day. LDL was 52 approximately 3 months ago. Plan repeat lipids 3 months.   #3 right SI joint pain. Physical therapy recommended--> ordered today [facility]). If not significantly improved over the next couple of months then she can return here to discuss SI joint injection or she can get further evaluation by her orthopedist.  INTERIM HX: Feeling well. Her right SI joint pain eventually resolved with improved exercise.  Great diet.  ROS -> no fevers, no CP, no SOB, no wheezing, no cough, no dizziness, no HAs, no rashes, no melena/hematochezia.  No polyuria or polydipsia.  No myalgias or arthralgias.  No focal weakness, paresthesias, or tremors.  No acute vision or hearing abnormalities.  No dysuria or unusual/new urinary urgency or frequency.  No recent changes in lower legs. No n/v/d or abd pain.  No palpitations.     Past Medical History:  Diagnosis Date   Allergic rhinitis    Environmental allergens--multiple.  Patient to start immunotherapy 2023   Diabetes mellitus (HCC) 05/2018   Hba1c 6.5%   Diverticulosis 06/2021   colonoscopy   Food allergy    peanut, cucumber, mushroom, orange, sesame seed--question of?--but all food allergy testing neg 2023 at allergist   Gestational diabetes    Both pregnancies.   Hyperlipemia, mixed 05/2018   Great response to statin   Left carpal tunnel syndrome    Migraines    Multiple thyroid  nodules    FNA of one nodule: benign follic  nodule/cyst on 11/15/17.  f/u u/s 08/2019 stable nodules, no criteria for bx.  Rpt 01/2021->2 new nodules, need f/u u/s 1 yr.  Annual repeat rec'd until stable for 5 yrs (Dr. McComb).   Ureterolithiasis    R Ureteroscopy with holmium laser 08/05/19. CaOx. Right sided ESWL 11/2021    Past Surgical History:  Procedure Laterality Date   BIOPSY THYROID  Right 11/15/2017   benign.     CESAREAN SECTION  08/03/2011   Procedure: CESAREAN SECTION;  Surgeon: Charlie CHRISTELLA Croak;  Location: [facility];  Service: Gynecology;  Laterality: N/A;   COLONOSCOPY  06/29/2020   no adenomas. Recall 2031   CYSTOSCOPY WITH RETROGRADE PYELOGRAM, URETEROSCOPY AND STENT PLACEMENT Right 08/05/2019   Procedure: CYSTOSCOPY WITH RETROGRADE PYELOGRAM, URETEROSCOPY AND STENT PLACEMENTwith laser;  Surgeon: Matilda Senior, MD;  Location: [facility];  Service: Urology;  Laterality: Right;  1 HR   EXTRACORPOREAL SHOCK WAVE LITHOTRIPSY Right 11/25/2021   Procedure: EXTRACORPOREAL SHOCK WAVE LITHOTRIPSY (ESWL);  Surgeon: Elisabeth Valli BIRCH, MD;  [facility];  Service: Urology;  Laterality: Right;   HOLMIUM LASER APPLICATION Right 08/05/2019   Procedure: HOLMIUM LASER APPLICATION;  Surgeon: Matilda Senior, MD;  Location: [facility];  Service: Urology;  Laterality: Right;   KNEE ARTHROSCOPY     WISDOM TOOTH EXTRACTION  2000    Outpatient Medications Prior to Visit  Medication Sig Dispense Refill   amoxicillin (AMOXIL) 500 MG capsule Take 500  mg by mouth 3 (three) times daily.     atorvastatin  (LIPITOR) 80 MG tablet Take 1 tablet (80 mg total) by mouth daily. 90 tablet 3   ezetimibe  (ZETIA ) 10 MG tablet Take 1 tablet (10 mg total) by mouth daily. 90 tablet 3   fluticasone  (FLONASE ) 50 MCG/ACT nasal spray Place 2 sprays into both nostrils daily. 16 g 6   Semaglutide ,0.25 or 0.5MG /DOS, (OZEMPIC , 0.25 OR 0.5 MG/DOSE,) 2 MG/3ML SOPN INJECT 0.5 mg into the skin every 7 days. 3 mL 3   empagliflozin  (JARDIANCE ) 10 MG  TABS tablet Take 1 tablet (10 mg total) by mouth daily. 90 tablet 3   No facility-administered medications prior to visit.    Allergies  Allergen Reactions   Metformin  And Related Other (See Comments)    UPSET STOMACH   Cucumber Extract Rash   Mushroom Extract Complex (Obsolete) Rash   Peanut-Containing Drug Products Rash   Soy Allergy (Obsolete) Rash    Review of Systems As per HPI  PE:    05/10/2024    8:05 AM 02/16/2024    7:50 AM 11/09/2023    8:03 AM  Vitals with BMI  Height 5' 1    Weight 125 lbs 3 oz 126 lbs 6 oz 138 lbs  BMI 23.67    Systolic 103 113 91  Diastolic 70 81 68  Pulse 79 84 87    Body mass index is 23.66 kg/m.  Physical Exam  Gen: Alert, well appearing.  Patient is oriented to person, place, time, and situation. AFFECT: pleasant, lucid thought and speech. No further exam today  LABS:  Last CBC Lab Results  Component Value Date   WBC 5.7 08/09/2023   HGB 14.1 08/09/2023   HCT 44.3 08/09/2023   MCV 93.9 08/09/2023   MCH 29.5 08/02/2019   RDW 14.1 08/09/2023   PLT 262.0 08/09/2023   Last metabolic panel Lab Results  Component Value Date   GLUCOSE 94 02/16/2024   NA 141 02/16/2024   K 4.1 02/16/2024   CL 105 02/16/2024   CO2 28 02/16/2024   BUN 15 02/16/2024   CREATININE 0.64 02/16/2024   GFR 99.84 02/16/2024   CALCIUM  9.5 02/16/2024   PROT 7.5 11/09/2023   ALBUMIN 4.5 11/09/2023   BILITOT 0.7 11/09/2023   ALKPHOS 70 11/09/2023   AST 22 11/09/2023   ALT 23 11/09/2023   ANIONGAP 9 08/02/2019   Last lipids Lab Results  Component Value Date   CHOL 118 11/09/2023   HDL 53.50 11/09/2023   LDLCALC 52 11/09/2023   TRIG 61.0 11/09/2023   CHOLHDL 2 11/09/2023   Last hemoglobin A1c Lab Results  Component Value Date   HGBA1C 5.8 (A) 02/16/2024   HGBA1C 5.8 02/16/2024   HGBA1C 5.8 02/16/2024   HGBA1C 5.8 02/16/2024   Last thyroid  functions Lab Results  Component Value Date   TSH 0.98 08/09/2023   T3TOTAL 100 10/15/2021    Last vitamin B12 and Folate Lab Results  Component Value Date   VITAMINB12 473 10/15/2021   IMPRESSION AND PLAN:  #1 diabetes without complication.  Great control. POC Hba1c today is 5.9%. She would like to decrease her Jardiance  to one half of the 10 mg tab daily. This is fine. We will continue 0.5 mg Ozempic  weekly.  #2 hypercholesterolemia, doing well on atorvastatin  80 mg a day and Zetia  10 mg a day. Last LDL was 52 approximately 7 months ago.  If it is the same today then we will take her off  the Zetia .  An After Visit Summary was printed and given to the patient.  FOLLOW UP: Return in about 3 months (around 08/10/2024) for annual CPE (fasting). Next CPE 07/2024 Signed:  Gerlene Hockey, MD           05/10/2024

## 2024-05-12 ENCOUNTER — Ambulatory Visit: Payer: Self-pay | Admitting: Family Medicine

## 2024-07-09 ENCOUNTER — Ambulatory Visit: Payer: Self-pay

## 2024-07-09 NOTE — Telephone Encounter (Signed)
 FYI Only or Action Required?: FYI only for provider.  Patient was last seen in primary care on 05/10/2024 by McGowen, Aleene DEL, MD.  Called Nurse Triage reporting Headache.  Symptoms began today.  Interventions attempted: Nothing.  Symptoms are: gradually worsening.  Triage Disposition: Go to ED Now (Notify PCP)  Patient/caregiver understands and will follow disposition?: Yes    Copied from CRM [id number]. Topic: Clinical - Red Word Triage >> Jul 09, 2024 10:50 AM Frederich PARAS wrote: [facility] that prompted transfer to Nurse Triage: headaches, loss of energy, weak pt wife has mild headaches, high temperature, fever, bad cold, this am she have been complainigng abotuhead ache, dehydrated, feel weak. very week loss of energy. Reason for Disposition  Loss of vision or double vision  (Exception: Same as previously diagnosed migraines.)  Answer Assessment - Initial Assessment Questions 1. LOCATION: Where does it hurt?      Front area 2. ONSET: When did the headache start? (e.g., minutes, hours, days)      This am or last night 3. PATTERN: Does the pain come and go, or has it been constant since it started?     Constant  4. SEVERITY: How bad is the pain? and What does it keep you from doing?  (e.g., Scale 1-10; mild, moderate, or severe)     moderate 5. RECURRENT SYMPTOM: Have you ever had headaches before? If Yes, ask: When was the last time? and What happened that time?      Yes, when she was sick 6. CAUSE: What do you think is causing the headache?     sick 7. MIGRAINE: Have you been diagnosed with migraine headaches? If Yes, ask: Is this headache similar?      no 8. HEAD INJURY: Has there been any recent injury to your head?      denies 9. OTHER SYMPTOMS: Do you have any other symptoms? (e.g., fever, stiff neck, eye pain, sore throat, cold symptoms)     HR 135, fatigue, fever,  10. PREGNANCY: Is there any chance you are pregnant? When was your last  menstrual period?       na  Protocols used: Headache-A-AH

## 2024-07-09 NOTE — Telephone Encounter (Signed)
 No further action needed. Pt sent to ER

## 2024-08-04 ENCOUNTER — Other Ambulatory Visit: Payer: Self-pay | Admitting: Family Medicine

## 2024-08-15 ENCOUNTER — Telehealth: Payer: Self-pay

## 2024-08-15 ENCOUNTER — Ambulatory Visit: Payer: Self-pay | Admitting: Family Medicine

## 2024-08-15 ENCOUNTER — Ambulatory Visit (INDEPENDENT_AMBULATORY_CARE_PROVIDER_SITE_OTHER): Admitting: Family Medicine

## 2024-08-15 VITALS — BP 99/67 | HR 80 | Temp 97.3°F | Ht 59.75 in | Wt 121.4 lb

## 2024-08-15 DIAGNOSIS — Z7984 Long term (current) use of oral hypoglycemic drugs: Secondary | ICD-10-CM

## 2024-08-15 DIAGNOSIS — R1013 Epigastric pain: Secondary | ICD-10-CM

## 2024-08-15 DIAGNOSIS — Z7985 Long-term (current) use of injectable non-insulin antidiabetic drugs: Secondary | ICD-10-CM | POA: Diagnosis not present

## 2024-08-15 DIAGNOSIS — E119 Type 2 diabetes mellitus without complications: Secondary | ICD-10-CM

## 2024-08-15 DIAGNOSIS — Z0000 Encounter for general adult medical examination without abnormal findings: Secondary | ICD-10-CM

## 2024-08-15 DIAGNOSIS — E7800 Pure hypercholesterolemia, unspecified: Secondary | ICD-10-CM

## 2024-08-15 DIAGNOSIS — R748 Abnormal levels of other serum enzymes: Secondary | ICD-10-CM

## 2024-08-15 LAB — COMPREHENSIVE METABOLIC PANEL WITH GFR
ALT: 44 U/L — ABNORMAL HIGH (ref 0–35)
AST: 27 U/L (ref 0–37)
Albumin: 4.4 g/dL (ref 3.5–5.2)
Alkaline Phosphatase: 84 U/L (ref 39–117)
BUN: 15 mg/dL (ref 6–23)
CO2: 29 meq/L (ref 19–32)
Calcium: 9.3 mg/dL (ref 8.4–10.5)
Chloride: 106 meq/L (ref 96–112)
Creatinine, Ser: 0.57 mg/dL (ref 0.40–1.20)
GFR: 102.31 mL/min (ref >60.00)
Glucose, Bld: 83 mg/dL (ref 70–99)
Potassium: 4.4 meq/L (ref 3.5–5.1)
Sodium: 141 meq/L (ref 135–145)
Total Bilirubin: 0.6 mg/dL (ref 0.2–1.2)
Total Protein: 7.4 g/dL (ref 6.0–8.3)

## 2024-08-15 LAB — LIPID PANEL
Cholesterol: 127 mg/dL (ref 0–200)
HDL: 47.5 mg/dL (ref >39.00)
LDL Cholesterol: 66 mg/dL (ref 0–99)
NonHDL: 79.67
Total CHOL/HDL Ratio: 3
Triglycerides: 70 mg/dL (ref 0.0–149.0)
VLDL: 14 mg/dL (ref 0.0–40.0)

## 2024-08-15 LAB — CBC WITH DIFFERENTIAL/PLATELET
Basophils Absolute: 0 K/uL (ref 0.0–0.1)
Basophils Relative: 0.6 % (ref 0.0–3.0)
Eosinophils Absolute: 0.1 K/uL (ref 0.0–0.7)
Eosinophils Relative: 1.8 % (ref 0.0–5.0)
HCT: 42.8 % (ref 36.0–46.0)
Hemoglobin: 14.3 g/dL (ref 12.0–15.0)
Lymphocytes Relative: 27.8 % (ref 12.0–46.0)
Lymphs Abs: 1.8 K/uL (ref 0.7–4.0)
MCHC: 33.5 g/dL (ref 30.0–36.0)
MCV: 91.2 fl (ref 78.0–100.0)
Monocytes Absolute: 0.4 K/uL (ref 0.1–1.0)
Monocytes Relative: 6.2 % (ref 3.0–12.0)
Neutro Abs: 4.1 K/uL (ref 1.4–7.7)
Neutrophils Relative %: 63.6 % (ref 43.0–77.0)
Platelets: 251 K/uL (ref 150.0–400.0)
RBC: 4.69 Mil/uL (ref 3.87–5.11)
RDW: 14.1 % (ref 11.5–15.5)
WBC: 6.5 K/uL (ref 4.0–10.5)

## 2024-08-15 LAB — POCT GLYCOSYLATED HEMOGLOBIN (HGB A1C)
HbA1c POC (<> result, manual entry): 5.8 % (ref 4.0–5.6)
HbA1c, POC (controlled diabetic range): 5.8 % (ref 0.0–7.0)
HbA1c, POC (prediabetic range): 5.8 % (ref 5.7–6.4)
Hemoglobin A1C: 5.8 % — AB (ref 4.0–5.6)

## 2024-08-15 LAB — LIPASE: Lipase: 108 U/L — ABNORMAL HIGH (ref 11.0–59.0)

## 2024-08-15 LAB — TSH: TSH: 0.7 u[IU]/mL (ref 0.35–5.50)

## 2024-08-15 NOTE — Telephone Encounter (Signed)
 Pt brought health screening form to CPE appt (11/6) for completion. CMA confirmed with pt that she would like the form faxed once labs are resulted. Please send pt a MyChart message once form has been faxed per pt request.

## 2024-08-15 NOTE — Progress Notes (Signed)
 Office Note 08/15/2024  CC:  Chief Complaint  Patient presents with   Annual Exam    Pt is fasting   Patient is a 53 y.o. female who is here for annual health maintenance exam and 17-month follow-up diabetes and hyperlipidemia. A/P as of last visit: #1 diabetes without complication.  Great control. POC Hba1c today is 5.9%. She would like to decrease her Jardiance  to one half of the 10 mg tab daily. This is fine. We will continue 0.5 mg Ozempic  weekly.   #2 hypercholesterolemia, doing well on atorvastatin  80 mg a day and Zetia  10 mg a day. Last LDL was 52 approximately 7 months ago.  If it is the same today then we will take her off the Zetia .  INTERIM HX: Overall Annasofia is doing well. She currently takes 10 mg Jardiance  and 0.5 mg Ozempic .  She notes that for about the last month she has felt diffuse epigastric pain and some nausea that is related to eating particular foods.  She cannot pinpoint exactly which type of food but knows that if she does not eat it does not hurt.  She does not vomit.  She has some constipation. No bloating, hematochezia, no melena.  No fevers or abnormal weight loss.    Past Medical History:  Diagnosis Date   Allergic rhinitis    Environmental allergens--multiple.  Patient to start immunotherapy 2023   Diabetes mellitus (HCC) 05/2018   Hba1c 6.5%   Diverticulosis 06/2021   colonoscopy   Food allergy    peanut, cucumber, mushroom, orange, sesame seed--question of?--but all food allergy testing neg 2023 at allergist   Gestational diabetes    Both pregnancies.   Hyperlipemia, mixed 05/2018   Great response to statin   Left carpal tunnel syndrome    Migraines    Multiple thyroid  nodules    FNA of one nodule: benign follic nodule/cyst on 11/15/17.  f/u u/s 08/2019 stable nodules, no criteria for bx.  Rpt 01/2021->2 new nodules, need f/u u/s 1 yr.  Annual repeat rec'd until stable for 5 yrs (Dr. McComb).   Ureterolithiasis    R Ureteroscopy with  holmium laser 08/05/19. CaOx. Right sided ESWL 11/2021    Past Surgical History:  Procedure Laterality Date   BIOPSY THYROID  Right 11/15/2017   benign.     CESAREAN SECTION  08/03/2011   Procedure: CESAREAN SECTION;  Surgeon: Charlie CHRISTELLA Croak;  Location: [facility];  Service: Gynecology;  Laterality: N/A;   COLONOSCOPY  06/29/2020   no adenomas. Recall 2031   CYSTOSCOPY WITH RETROGRADE PYELOGRAM, URETEROSCOPY AND STENT PLACEMENT Right 08/05/2019   Procedure: CYSTOSCOPY WITH RETROGRADE PYELOGRAM, URETEROSCOPY AND STENT PLACEMENTwith laser;  Surgeon: Matilda Senior, MD;  Location: [facility];  Service: Urology;  Laterality: Right;  1 HR   EXTRACORPOREAL SHOCK WAVE LITHOTRIPSY Right 11/25/2021   Procedure: EXTRACORPOREAL SHOCK WAVE LITHOTRIPSY (ESWL);  Surgeon: Elisabeth Valli BIRCH, MD;  Location: [facility];  Service: Urology;  Laterality: Right;   HOLMIUM LASER APPLICATION Right 08/05/2019   Procedure: HOLMIUM LASER APPLICATION;  Surgeon: Matilda Senior, MD;  Location: [facility];  Service: Urology;  Laterality: Right;   KNEE ARTHROSCOPY     WISDOM TOOTH EXTRACTION  2000    Family History  Problem Relation Age of Onset   Hypertension Mother    Diabetes Mother    Lung cancer Mother        stage 4, non-smoker   Hypertension Father    Hyperlipidemia Father    Diabetes Sister  Hyperlipidemia Brother    Diabetes Brother    Diabetes Brother    Hyperlipidemia Brother    Colon polyps Neg Hx    Colon cancer Neg Hx    Esophageal cancer Neg Hx    Stomach cancer Neg Hx    Rectal cancer Neg Hx     Social History   Socioeconomic History   Marital status: Married    Spouse name: Not on file   Number of children: Not on file   Years of education: Not on file   Highest education level: Master's degree (e.g., MA, MS, MEng, MEd, MSW, MBA)  Occupational History   Not on file  Tobacco Use   Smoking status: Never   Smokeless tobacco: Never  Vaping Use   Vaping status: Never  Used  Substance and Sexual Activity   Alcohol use: No   Drug use: No   Sexual activity: Yes    Birth control/protection: None  Other Topics Concern   Not on file  Social History Narrative   Married, 2 children.   Lives in [place].   Educ: Masters   Water Quality Scientist at [employer]   Social Drivers of Corporate Investment Banker Strain: Low Risk  (08/15/2024)   Overall Financial Resource Strain (CARDIA)    Difficulty of Paying Living Expenses: Not very hard  Food Insecurity: Food Insecurity Present (08/15/2024)   Hunger Vital Sign    Worried About Running Out of Food in the Last Year: Sometimes true    Ran Out of Food in the Last Year: Never true  Transportation Needs: No Transportation Needs (08/15/2024)   PRAPARE - Administrator, Civil Service (Medical): No    Lack of Transportation (Non-Medical): No  Physical Activity: Insufficiently Active (08/15/2024)   Exercise Vital Sign    Days of Exercise per Week: 4 days    Minutes of Exercise per Session: 10 min  Stress: Stress Concern Present (08/15/2024)   [employer] of Occupational Health - Occupational Stress Questionnaire    Feeling of Stress: To some extent  Social Connections: Moderately Integrated (08/15/2024)   Social Connection and Isolation Panel    Frequency of Communication with Friends and Family: Twice a week    Frequency of Social Gatherings with Friends and Family: Once a week    Attends Religious Services: 1 to 4 times per year    Active Member of [employer] or Organizations: No    Attends Engineer, Structural: Not on file    Marital Status: Married  Intimate Partner Violence: Unknown (01/12/2022)   Received from [facility]   HITS    Physically Hurt: Not on file    Insult or Talk Down To: Not on file    Threaten Physical Harm: Not on file    Scream or Curse: Not on file    Outpatient Medications Prior to Visit  Medication Sig Dispense Refill   atorvastatin  (LIPITOR) 80 MG tablet Take 1  tablet (80 mg total) by mouth daily. 90 tablet 3   fluticasone  (FLONASE ) 50 MCG/ACT nasal spray Place 2 sprays into both nostrils daily. 16 g 6   Semaglutide ,0.25 or 0.5MG /DOS, (OZEMPIC , 0.25 OR 0.5 MG/DOSE,) 2 MG/3ML SOPN INJECT 0.5 MG INTO THE SKIN EVERY 7 DAYS 2 mL 0   amoxicillin (AMOXIL) 500 MG capsule Take 500 mg by mouth 3 (three) times daily.     empagliflozin  (JARDIANCE ) 10 MG TABS tablet 1/2 tab po qd     ezetimibe  (ZETIA ) 10 MG  tablet Take 1 tablet (10 mg total) by mouth daily. 90 tablet 3   No facility-administered medications prior to visit.    Allergies  Allergen Reactions   Metformin  And Related Other (See Comments)    UPSET STOMACH   Cucumber Extract Rash   Mushroom Extract Complex (Obsolete) Rash   Peanut-Containing Drug Products Rash   Soy Allergy (Obsolete) Rash    Review of Systems  Constitutional:  Negative for appetite change, chills, fatigue and fever.  HENT:  Negative for congestion, dental problem, ear pain and sore throat.   Eyes:  Negative for discharge, redness and visual disturbance.  Respiratory:  Negative for cough, chest tightness, shortness of breath and wheezing.   Cardiovascular:  Negative for chest pain, palpitations and leg swelling.  Gastrointestinal:  Positive for abdominal pain and nausea. Negative for blood in stool, diarrhea and vomiting.  Genitourinary:  Negative for difficulty urinating, dysuria, flank pain, frequency, hematuria and urgency.  Musculoskeletal:  Negative for arthralgias, back pain, joint swelling, myalgias and neck stiffness.  Skin:  Negative for pallor and rash.  Neurological:  Negative for dizziness, speech difficulty, weakness and headaches.  Hematological:  Negative for adenopathy. Does not bruise/bleed easily.  Psychiatric/Behavioral:  Negative for confusion and sleep disturbance. The patient is not nervous/anxious.     PE;    08/15/2024    8:20 AM 05/10/2024    8:05 AM 02/16/2024    7:50 AM  Vitals with BMI  Height  4' 11.75 5' 1   Weight 121 lbs 6 oz 125 lbs 3 oz 126 lbs 6 oz  BMI 23.9 23.67   Systolic 99 103 113  Diastolic 67 70 81  Pulse 80 79 84   Exam chaperoned by Jesusa Hahn, CMA  Gen: Alert, well appearing.  Patient is oriented to person, place, time, and situation. AFFECT: pleasant, lucid thought and speech. ENT: Ears: EACs clear, normal epithelium.  TMs with good light reflex and landmarks bilaterally.  Eyes: no injection, icteris, swelling, or exudate.  EOMI, PERRLA. Nose: no drainage or turbinate edema/swelling.  No injection or focal lesion.  Mouth: lips without lesion/swelling.  Oral mucosa pink and moist.  Dentition intact and without obvious caries or gingival swelling.  Oropharynx without erythema, exudate, or swelling.  Neck: supple/nontender.  No LAD, mass, or TM.  Carotid pulses 2+ bilaterally, without bruits. CV: RRR, no m/r/g.   LUNGS: CTA bilat, nonlabored resps, good aeration in all lung fields. ABD: soft, NT, ND, BS normal.  No hepatospenomegaly or mass.  No bruits. EXT: no clubbing, cyanosis, or edema.  Musculoskeletal: no joint swelling, erythema, warmth, or tenderness.  ROM of all joints intact. Skin - no sores or suspicious lesions or rashes or color changes  Pertinent labs:  Lab Results  Component Value Date   TSH 0.98 08/09/2023   Lab Results  Component Value Date   WBC 5.7 08/09/2023   HGB 14.1 08/09/2023   HCT 44.3 08/09/2023   MCV 93.9 08/09/2023   PLT 262.0 08/09/2023   Lab Results  Component Value Date   CREATININE 0.59 05/10/2024   BUN 18 05/10/2024   NA 141 05/10/2024   K 4.4 05/10/2024   CL 106 05/10/2024   CO2 28 05/10/2024   Lab Results  Component Value Date   ALT 41 (H) 05/10/2024   AST 25 05/10/2024   ALKPHOS 74 05/10/2024   BILITOT 0.5 05/10/2024   Lab Results  Component Value Date   CHOL 111 05/10/2024   Lab Results  Component  Value Date   HDL 51.80 05/10/2024   Lab Results  Component Value Date   LDLCALC 48 05/10/2024    Lab Results  Component Value Date   TRIG 55.0 05/10/2024   Lab Results  Component Value Date   CHOLHDL 2 05/10/2024   Lab Results  Component Value Date   HGBA1C 5.8 (A) 08/15/2024   HGBA1C 5.8 08/15/2024   HGBA1C 5.8 08/15/2024   HGBA1C 5.8 08/15/2024   ASSESSMENT AND PLAN:   1) Health maintenance exam: Reviewed age and gender appropriate health maintenance issues (prudent diet, regular exercise, health risks of tobacco and excessive alcohol, use of seatbelts, fire alarms in home, use of sunscreen).  Also reviewed age and gender appropriate health screening as well as vaccine recommendations. Vaccines: Flu->declined b/c already had flu infection this season.  Otherwise all utd. Labs: fasting HP, Hba1c. Cervical ca screening: per pt's GYN. Breast ca screening: per pt's GYN. Colon ca screening: recall 2031  2) diabetes without complication.  Great control. POC Hba1c today is 5.8%. She would like to discontinue Jardiance  completely.  This is fine. We will continue 0.5 mg Ozempic  weekly. Check renal function today.  3) hypercholesterolemia, doing well on atorvastatin  80 mg a day. Last LDL was 48 approximately 6 months ago.  If it is the same today then we will decrease her atorvastatin  to 40 mg a day.    #4 epigastric pain with nausea.  Suspect dyspepsia/GERD. Lower suspicion that this is anything related to her Ozempic  since she has been on it quite a while and this just started. Discussed avoidance of trigger foods and as needed use of over-the-counter Pepcid . Will check metabolic panel and lipase today as well.  An After Visit Summary was printed and given to the patient.  FOLLOW UP:  Return in about 6 months (around 02/12/2025) for routine chronic illness f/u.  Signed:  Gerlene Hockey, MD           08/15/2024

## 2024-08-15 NOTE — Patient Instructions (Signed)

## 2024-08-16 ENCOUNTER — Encounter: Payer: Self-pay | Admitting: Family Medicine

## 2024-08-16 NOTE — Telephone Encounter (Signed)
 Yes, decrease dose to 40 mg of atorvastatin  daily. Can cut the 80 mg tabs in half if you still have some. If you do not have some then let me know and I will send in a prescription for the 40 mg tabs.

## 2024-08-16 NOTE — Telephone Encounter (Signed)
 Pt has reviewed lab results and provider recommendations. Pt would like to confirm about atorvastatin  dose

## 2024-08-16 NOTE — Telephone Encounter (Signed)
 Form completed and message sent to pt via MyChart informing completed form has been faxed back.

## 2024-08-16 NOTE — Telephone Encounter (Signed)
 No further action needed at this time. MyChart message read by pt

## 2024-08-23 ENCOUNTER — Ambulatory Visit
Admission: RE | Admit: 2024-08-23 | Discharge: 2024-08-23 | Disposition: A | Discharge status: Home / Self Care | Source: Ambulatory Visit | Attending: Family Medicine | Admitting: Family Medicine

## 2024-08-23 DIAGNOSIS — R748 Abnormal levels of other serum enzymes: Secondary | ICD-10-CM

## 2024-08-23 DIAGNOSIS — R1013 Epigastric pain: Secondary | ICD-10-CM

## 2024-08-23 MED ORDER — IOPAMIDOL (ISOVUE-300) INJECTION 61%
80.0000 mL | Freq: Once | INTRAVENOUS | Status: AC | PRN
  Administered 2024-08-23: 80 mL via INTRAVENOUS

## 2024-08-25 ENCOUNTER — Other Ambulatory Visit: Payer: Self-pay | Admitting: Family Medicine

## 2024-08-25 ENCOUNTER — Encounter: Payer: Self-pay | Admitting: Family Medicine

## 2024-08-25 ENCOUNTER — Ambulatory Visit: Payer: Self-pay | Admitting: Family Medicine

## 2024-08-28 ENCOUNTER — Other Ambulatory Visit: Payer: Self-pay | Admitting: Family Medicine

## 2024-08-28 DIAGNOSIS — Z1231 Encounter for screening mammogram for malignant neoplasm of breast: Secondary | ICD-10-CM

## 2024-08-28 NOTE — Telephone Encounter (Signed)
 Copied from CRM #[id number]. Topic: Appointments - Appointment Info/Confirmation >> Aug 28, 2024  4:10 PM Aisha D wrote: Pt wants to verify if she needs to fast for her 4 week follow up appt on 12/15. Pt would like a callback with an update.  Please confirm

## 2024-08-29 NOTE — Telephone Encounter (Signed)
 She does not need to fast

## 2024-09-12 ENCOUNTER — Ambulatory Visit: Admitting: Family Medicine

## 2024-09-19 ENCOUNTER — Encounter: Payer: Self-pay | Admitting: Family Medicine

## 2024-09-19 ENCOUNTER — Ambulatory Visit: Admitting: Family Medicine

## 2024-09-19 VITALS — BP 108/75 | HR 73 | Temp 97.4°F | Ht 59.75 in | Wt 123.4 lb

## 2024-09-19 DIAGNOSIS — R748 Abnormal levels of other serum enzymes: Secondary | ICD-10-CM | POA: Diagnosis not present

## 2024-09-19 DIAGNOSIS — E119 Type 2 diabetes mellitus without complications: Secondary | ICD-10-CM | POA: Diagnosis not present

## 2024-09-19 DIAGNOSIS — R1011 Right upper quadrant pain: Secondary | ICD-10-CM

## 2024-09-19 LAB — HEPATIC FUNCTION PANEL
ALT: 45 U/L — ABNORMAL HIGH (ref 0–35)
AST: 26 U/L (ref 0–37)
Albumin: 4.2 g/dL (ref 3.5–5.2)
Alkaline Phosphatase: 72 U/L (ref 39–117)
Bilirubin, Direct: 0.1 mg/dL (ref 0.0–0.3)
Total Bilirubin: 0.7 mg/dL (ref 0.2–1.2)
Total Protein: 7 g/dL (ref 6.0–8.3)

## 2024-09-19 LAB — LIPASE: Lipase: 48 U/L (ref 11.0–59.0)

## 2024-09-19 MED ORDER — FREESTYLE LIBRE 3 READER DEVI: 3 refills | Status: AC

## 2024-09-19 MED ORDER — FREESTYLE LIBRE 3 PLUS SENSOR MISC: 3 refills | Status: AC

## 2024-09-19 NOTE — Progress Notes (Signed)
 OFFICE VISIT  09/19/2024  CC:  Chief Complaint  Patient presents with   Follow-up    4 weeks; repeat lipase today    Patient is a 53 y.o. female who presents for 1 month follow-up epigastric pain and nausea elevated lipase. A/P as of last visit: epigastric pain with nausea.  Suspect dyspepsia/GERD. Lower suspicion that this is anything related to her Ozempic  since she has been on it quite a while and this just started. Discussed avoidance of trigger foods and as needed use of over-the-counter Pepcid . Will check metabolic panel and lipase today as well.  INTERIM HX: Labs all normal last visit except lipase mildly elevated. A CT scan of the abdomen and pelvis showed no acute findings.  No evidence of pancreatitis or other significant abnormality.  D/c of ozempic  and jardiance  have led to signif imp --->no more n/v or epig pain. She also cut her atorvastatin  back to a half of a 80 mg tab daily. She does say she has some right upper quadrant pain after eating fatty/greasy foods and it does bring some nausea without vomiting.  This does not happen with a lot of frequency, though.  No fevers.  She does have some constipation.  No blood in stool.  No weight loss.    Past Medical History:  Diagnosis Date   Allergic rhinitis    Environmental allergens--multiple.  Patient to start immunotherapy 2023   Allergy    Diabetes mellitus (HCC) 05/2018   Hba1c 6.5%   Diverticulosis 06/2021   colonoscopy   Food allergy    peanut, cucumber, mushroom, orange, sesame seed--question of?--but all food allergy testing neg 2023 at allergist   GERD (gastroesophageal reflux disease) 08/2021   Gestational diabetes    Both pregnancies.   Hyperlipemia, mixed 05/2018   Great response to statin   Left carpal tunnel syndrome    Migraines    Multiple thyroid  nodules    FNA of one nodule: benign follic nodule/cyst on 11/15/17.  f/u u/s 08/2019 stable nodules, no criteria for bx.  Rpt 01/2021->2 new nodules,  need f/u u/s 1 yr.  Annual repeat rec'd until stable for 5 yrs (Dr. McComb).   Ureterolithiasis    R Ureteroscopy with holmium laser 08/05/19. CaOx. Right sided ESWL 11/2021    Past Surgical History:  Procedure Laterality Date   BIOPSY THYROID  Right 11/15/2017   benign.     CESAREAN SECTION  08/03/2011   Procedure: CESAREAN SECTION;  Surgeon: Charlie CHRISTELLA Croak;  Location: [facility];  Service: Gynecology;  Laterality: N/A;   COLONOSCOPY  06/29/2020   no adenomas. Recall 2031   CYSTOSCOPY WITH RETROGRADE PYELOGRAM, URETEROSCOPY AND STENT PLACEMENT Right 08/05/2019   Procedure: CYSTOSCOPY WITH RETROGRADE PYELOGRAM, URETEROSCOPY AND STENT PLACEMENTwith laser;  Surgeon: Matilda Senior, MD;  Location: [facility];  Service: Urology;  Laterality: Right;  1 HR   EXTRACORPOREAL SHOCK WAVE LITHOTRIPSY Right 11/25/2021   Procedure: EXTRACORPOREAL SHOCK WAVE LITHOTRIPSY (ESWL);  Surgeon: Elisabeth Valli BIRCH, MD;  Location: [facility];  Service: Urology;  Laterality: Right;   HOLMIUM LASER APPLICATION Right 08/05/2019   Procedure: HOLMIUM LASER APPLICATION;  Surgeon: Matilda Senior, MD;  Location: [facility];  Service: Urology;  Laterality: Right;   KNEE ARTHROSCOPY     WISDOM TOOTH EXTRACTION  2000    Outpatient Medications Prior to Visit  Medication Sig Dispense Refill   fluticasone  (FLONASE ) 50 MCG/ACT nasal spray Place 2 sprays into both nostrils daily. 16 g 6   atorvastatin  (LIPITOR) 80 MG  tablet Take 1 tablet (80 mg total) by mouth daily. 90 tablet 3   No facility-administered medications prior to visit.    Allergies[1]  Review of Systems As per HPI  PE:    09/19/2024   10:17 AM 08/15/2024    8:20 AM 05/10/2024    8:05 AM  Vitals with BMI  Height 4' 11.75 4' 11.75 5' 1  Weight 123 lbs 6 oz 121 lbs 6 oz 125 lbs 3 oz  BMI 24.29 23.9 23.67  Systolic 108 99 103  Diastolic 75 67 70  Pulse 73 80 79     Physical Exam  Gen: Alert, well appearing.  Patient is oriented  to person, place, time, and situation. AFFECT: pleasant, lucid thought and speech. No jaundice or pallor. No further exam today  LABS:  Last CBC Lab Results  Component Value Date   WBC 6.5 08/15/2024   HGB 14.3 08/15/2024   HCT 42.8 08/15/2024   MCV 91.2 08/15/2024   MCH 29.5 08/02/2019   RDW 14.1 08/15/2024   PLT 251.0 08/15/2024   Last metabolic panel Lab Results  Component Value Date   GLUCOSE 83 08/15/2024   NA 141 08/15/2024   K 4.4 08/15/2024   CL 106 08/15/2024   CO2 29 08/15/2024   BUN 15 08/15/2024   CREATININE 0.57 08/15/2024   GFR 102.31 08/15/2024   CALCIUM  9.3 08/15/2024   PROT 7.4 08/15/2024   ALBUMIN 4.4 08/15/2024   BILITOT 0.6 08/15/2024   ALKPHOS 84 08/15/2024   AST 27 08/15/2024   ALT 44 (H) 08/15/2024   ANIONGAP 9 08/02/2019   Last lipids Lab Results  Component Value Date   CHOL 127 08/15/2024   HDL 47.50 08/15/2024   LDLCALC 66 08/15/2024   TRIG 70.0 08/15/2024   CHOLHDL 3 08/15/2024   Last hemoglobin A1c Lab Results  Component Value Date   HGBA1C 5.8 (A) 08/15/2024   HGBA1C 5.8 08/15/2024   HGBA1C 5.8 08/15/2024   HGBA1C 5.8 08/15/2024   Last thyroid  functions Lab Results  Component Value Date   TSH 0.70 08/15/2024   T3TOTAL 100 10/15/2021   FREET4 1.03 10/15/2021   Last vitamin B12 and Folate Lab Results  Component Value Date   VITAMINB12 473 10/15/2021   Lab Results  Component Value Date   LIPASE 108.0 (H) 08/15/2024   IMPRESSION AND PLAN:  #1 recurrent upper abdominal pain.  Initially this was more like mid epigastric pain with nausea (dyspepsia?) and it seems to have responded to cutting Lipitor in half as well as getting off Jardiance  and Ozempic . Now she has what sounds more like intermittent right upper quadrant pain related to eating high-fat and greasy meals. With her recent mildly elevated lipase I wonder about this being from a very transient biliary obstruction from a stone. However, CT abdomen with contrast  did not show a stone or any ductal dilatation.  Will get right upper quadrant since this is the most sensitive test for gallbladder/gallstones. Repeat lipase today.  We will keep her off GLP-1 and Jardiance  and continue at the atorvastatin  40 mg daily dose.  An After Visit Summary was printed and given to the patient.  FOLLOW UP: Return for keep appt set for May 2026.  Signed:  Gerlene Hockey, MD           09/19/2024      [1]  Allergies Allergen Reactions   Metformin  And Related Other (See Comments)    UPSET STOMACH   Ozempic  (0.25 Or 0.5  Mg-Dose) [Semaglutide (0.25 Or 0.5mg -Dos)] Other (See Comments)    Epig pain, ?elev lipase   Cucumber Extract Rash   Mushroom Extract Complex (Obsolete) Rash   Peanut-Containing Drug Products Rash   Soy Allergy (Obsolete) Rash

## 2024-09-20 ENCOUNTER — Ambulatory Visit: Payer: Self-pay | Admitting: Family Medicine

## 2024-09-20 ENCOUNTER — Ambulatory Visit: Admitting: Family Medicine

## 2024-09-23 ENCOUNTER — Ambulatory Visit: Admitting: Family Medicine

## 2024-09-23 ENCOUNTER — Other Ambulatory Visit: Payer: Self-pay | Admitting: Obstetrics and Gynecology

## 2024-09-23 DIAGNOSIS — E041 Nontoxic single thyroid nodule: Secondary | ICD-10-CM

## 2024-09-23 NOTE — Progress Notes (Signed)
 noted

## 2024-09-24 ENCOUNTER — Inpatient Hospital Stay: Admission: RE | Admit: 2024-09-24 | Discharge: 2024-09-24 | Attending: Family Medicine | Admitting: Family Medicine

## 2024-09-24 DIAGNOSIS — R748 Abnormal levels of other serum enzymes: Secondary | ICD-10-CM

## 2024-09-24 DIAGNOSIS — R1011 Right upper quadrant pain: Secondary | ICD-10-CM

## 2024-09-26 ENCOUNTER — Encounter: Payer: Self-pay | Admitting: Family Medicine

## 2024-09-26 ENCOUNTER — Inpatient Hospital Stay: Admission: RE | Admit: 2024-09-26 | Discharge: 2024-09-26 | Attending: Family Medicine | Admitting: Family Medicine

## 2024-09-26 DIAGNOSIS — Z1231 Encounter for screening mammogram for malignant neoplasm of breast: Secondary | ICD-10-CM

## 2024-09-27 ENCOUNTER — Ambulatory Visit
Admission: RE | Admit: 2024-09-27 | Discharge: 2024-09-27 | Disposition: A | Source: Ambulatory Visit | Attending: Obstetrics and Gynecology | Admitting: Obstetrics and Gynecology

## 2024-09-27 DIAGNOSIS — E041 Nontoxic single thyroid nodule: Secondary | ICD-10-CM

## 2024-11-13 ENCOUNTER — Encounter: Payer: Self-pay | Admitting: Family Medicine

## 2025-02-17 ENCOUNTER — Ambulatory Visit: Admitting: Family Medicine
